# Patient Record
Sex: Female | Born: 1935 | Race: White | Hispanic: No | Smoking: Never smoker
Health system: Southern US, Community
[De-identification: ages and names within clinical notes are randomized; demographics above are authoritative.]

## PROBLEM LIST (undated history)

## (undated) DIAGNOSIS — I10 Essential (primary) hypertension: Secondary | ICD-10-CM

## (undated) DIAGNOSIS — I219 Acute myocardial infarction, unspecified: Secondary | ICD-10-CM

## (undated) DIAGNOSIS — E785 Hyperlipidemia, unspecified: Secondary | ICD-10-CM

## (undated) DIAGNOSIS — I2510 Atherosclerotic heart disease of native coronary artery without angina pectoris: Secondary | ICD-10-CM

## (undated) DIAGNOSIS — R55 Syncope and collapse: Secondary | ICD-10-CM

## (undated) HISTORY — PX: CATARACT EXTRACTION, BILATERAL: SHX1313

## (undated) HISTORY — PX: CORONARY ANGIOPLASTY: SHX604

## (undated) HISTORY — DX: Atherosclerotic heart disease of native coronary artery without angina pectoris: I25.10

## (undated) HISTORY — PX: RENAL ARTERY STENT: SHX2321

## (undated) HISTORY — DX: Essential (primary) hypertension: I10

## (undated) HISTORY — DX: Hyperlipidemia, unspecified: E78.5

---

## 1994-03-16 HISTORY — PX: CORONARY ARTERY BYPASS GRAFT: SHX141

## 2010-06-19 ENCOUNTER — Other Ambulatory Visit: Payer: Self-pay | Admitting: Cardiovascular Disease

## 2010-06-19 ENCOUNTER — Encounter: Payer: Self-pay | Admitting: Cardiovascular Disease

## 2010-06-19 DIAGNOSIS — I10 Essential (primary) hypertension: Secondary | ICD-10-CM

## 2010-06-19 DIAGNOSIS — Z79899 Other long term (current) drug therapy: Secondary | ICD-10-CM

## 2010-06-19 DIAGNOSIS — E785 Hyperlipidemia, unspecified: Secondary | ICD-10-CM

## 2010-06-23 ENCOUNTER — Encounter: Payer: Self-pay | Admitting: Cardiovascular Disease

## 2010-06-23 ENCOUNTER — Other Ambulatory Visit: Payer: Self-pay | Admitting: Cardiovascular Disease

## 2010-06-23 ENCOUNTER — Ambulatory Visit: Payer: Self-pay | Admitting: Cardiovascular Disease

## 2010-06-23 ENCOUNTER — Other Ambulatory Visit: Payer: Medicare Other | Admitting: *Deleted

## 2010-06-23 VITALS — BP 120/82 | HR 70 | Wt 101.0 lb

## 2010-06-23 DIAGNOSIS — Z79899 Other long term (current) drug therapy: Secondary | ICD-10-CM

## 2010-06-23 DIAGNOSIS — E782 Mixed hyperlipidemia: Secondary | ICD-10-CM | POA: Insufficient documentation

## 2010-06-23 DIAGNOSIS — E785 Hyperlipidemia, unspecified: Secondary | ICD-10-CM

## 2010-06-23 DIAGNOSIS — I10 Essential (primary) hypertension: Secondary | ICD-10-CM

## 2010-06-23 DIAGNOSIS — I25118 Atherosclerotic heart disease of native coronary artery with other forms of angina pectoris: Secondary | ICD-10-CM | POA: Insufficient documentation

## 2010-06-23 DIAGNOSIS — R5383 Other fatigue: Secondary | ICD-10-CM | POA: Insufficient documentation

## 2010-06-23 LAB — HEPATIC FUNCTION PANEL
Albumin: 4.4 g/dL (ref 3.5–5.2)
Total Bilirubin: 0.8 mg/dL (ref 0.3–1.2)

## 2010-06-23 LAB — LIPID PANEL
Cholesterol: 219 mg/dL — ABNORMAL HIGH (ref 0–200)
HDL: 79.9 mg/dL (ref >39.00)
Triglycerides: 65 mg/dL (ref 0.0–149.0)
VLDL: 13 mg/dL (ref 0.0–40.0)

## 2010-06-23 LAB — BASIC METABOLIC PANEL
BUN: 15 mg/dL (ref 6–23)
Creatinine, Ser: 0.9 mg/dL (ref 0.4–1.2)
GFR: 68.84 mL/min (ref >60.00)
Glucose, Bld: 78 mg/dL (ref 70–99)

## 2010-06-23 LAB — LDL CHOLESTEROL, DIRECT: Direct LDL: 120.5 mg/dL

## 2010-06-23 LAB — TSH: TSH: 2.67 u[IU]/mL (ref 0.35–5.50)

## 2010-06-23 MED ORDER — EZETIMIBE 10 MG PO TABS: 10.0000 mg | ORAL_TABLET | Freq: Every day | ORAL | Status: DC

## 2010-06-23 MED ORDER — METOPROLOL TARTRATE 25 MG PO TABS: 25.0000 mg | ORAL_TABLET | Freq: Two times a day (BID) | ORAL | Status: DC

## 2010-06-23 MED ORDER — LISINOPRIL 10 MG PO TABS: 10.0000 mg | ORAL_TABLET | Freq: Every day | ORAL | Status: DC

## 2010-06-23 NOTE — Progress Notes (Signed)
History of Present Illness:  Sally Miller is an elderly female with a history of coronary artery disease. She status post CABG in 1996. She also status post PTCA and stenting of her left anterior descending artery via her IMA artery. She has a history of hyperlipidemia and hypertension.  She has not had any episodes of chest pain or shortness breath. She complains of being very fatigued. She has no energy. She also notes some hair loss. Current Outpatient Prescriptions on File Prior to Visit  Medication Sig Dispense Refill  . aspirin 81 MG tablet Take 81 mg by mouth daily.       . rosuvastatin (CRESTOR) 10 MG tablet Take 10 mg by mouth daily.       Marland Kitchen DISCONTD: ezetimibe (ZETIA) 10 MG tablet Take 10 mg by mouth daily.       Marland Kitchen DISCONTD: lisinopril (PRINIVIL,ZESTRIL) 10 MG tablet Take 10 mg by mouth daily.       Marland Kitchen DISCONTD: metoprolol tartrate (LOPRESSOR) 25 MG tablet Take 25 mg by mouth 2 (two) times daily.         No Known Allergies  Past Medical History  Diagnosis Date  . CAD (coronary artery disease)   . Hyperlipemia   . HTN (hypertension)     Past Surgical History  Procedure Date  . Coronary artery bypass graft 1996  . Coronary angioplasty   . Renal artery stent     History  Smoking status  . Never Smoker   Smokeless tobacco  . Not on file    History  Alcohol Use No    Family History  Problem Relation Age of Onset  . Heart attack Brother   . Heart attack Brother   . Heart failure Sister   . Coronary artery disease Sister   . Coronary artery disease Brother     had cabg    Reviw of Systems:  The patient denies any heat or cold intolerance.  No weight gain or weight loss.  The patient denies headaches or blurry vision.  There is no cough or sputum production.  The patient denies dizziness.  There is no hematuria or hematochezia.  The patient denies any muscle aches or arthritis.  The patient denies any rash.  The patient denies frequent falling or instability.  There is  no history of depression or anxiety.  All other systems were reviewed and are negative.  Physical Exam: BP 120/82  Pulse 70  Wt 101 lb (45.813 kg) The patient is alert and oriented x 3.  The mood and affect are normal.  The skin is warm and dry.  Color is normal.  The HEENT exam reveals that the sclera are nonicteric.  The mucous membranes are moist.  The carotids are 2+ without bruits.  There is no thyromegaly.  There is no JVD.  The lungs are clear.  The chest wall is non tender.  The heart exam reveals a regular rate with a normal S1 and S2.  There are no murmurs, gallops, or rubs.  The PMI is not displaced.   Abdominal exam reveals good bowel sounds.  There is no guarding or rebound.  There is no hepatosplenomegaly or tenderness.  There are no masses.  Exam of the legs reveal no clubbing, cyanosis, or edema.  The legs are without rashes.  The distal pulses are intact.  Cranial nerves II - XII are intact.  Motor and sensory functions are intact.  The gait is normal.  ECG: Normal sinus rhythm. She has  no ST or T wave changes. Assessment / Plan:   This encounter was created in error - please disregard.

## 2010-06-23 NOTE — Assessment & Plan Note (Signed)
Fatigue seems to be her biggest complaint today. We'll check a TSH and a vitamin D level. She has had some other lab work done at her medical doctor's office. We may need to check a CBC and complete metabolic profile at her next visit

## 2010-06-23 NOTE — Assessment & Plan Note (Signed)
With the is stable from a cardiac standpoint. I'm pleased that she's not having any episodes of chest pain or shortness breath. We will continue with her same medications.

## 2010-06-25 LAB — VITAMIN D 25 HYDROXY (VIT D DEFICIENCY, FRACTURES): Vit D, 25-Hydroxy: 37 ng/mL (ref 30–89)

## 2010-06-26 ENCOUNTER — Other Ambulatory Visit: Payer: Self-pay | Admitting: Cardiovascular Disease

## 2010-06-26 ENCOUNTER — Telehealth: Payer: Self-pay | Admitting: Cardiovascular Disease

## 2010-06-26 DIAGNOSIS — E785 Hyperlipidemia, unspecified: Secondary | ICD-10-CM

## 2010-06-26 NOTE — Telephone Encounter (Signed)
Patient called with lab results. Pt verbalized understanding. Pt will increase her crestor to 10mg  po daily, pt was on 5mg  daily for one month..will re check labs in October. Is this ok with you. Alfonso Ramus RN

## 2010-06-27 ENCOUNTER — Telehealth: Payer: Self-pay | Admitting: *Deleted

## 2010-06-27 NOTE — Telephone Encounter (Signed)
Patient called with lab results. Vit D 1000IU daily . Pt verbalized understanding. Alfonso Ramus RN

## 2010-10-18 ENCOUNTER — Other Ambulatory Visit: Payer: Self-pay | Admitting: Cardiovascular Disease

## 2010-10-20 ENCOUNTER — Other Ambulatory Visit: Payer: Self-pay | Admitting: *Deleted

## 2010-10-20 MED ORDER — ROSUVASTATIN CALCIUM 10 MG PO TABS: 10.0000 mg | ORAL_TABLET | Freq: Every day | ORAL | Status: DC

## 2010-10-20 NOTE — Telephone Encounter (Signed)
Fax received from pharmacy. Refill completed. Jodette Laylia Mui RN  

## 2011-03-03 ENCOUNTER — Encounter: Payer: Self-pay | Admitting: Cardiovascular Disease

## 2011-03-03 ENCOUNTER — Ambulatory Visit (INDEPENDENT_AMBULATORY_CARE_PROVIDER_SITE_OTHER): Payer: Medicare Other | Admitting: Cardiovascular Disease

## 2011-03-03 VITALS — BP 110/70 | HR 70 | Ht 62.0 in | Wt 96.8 lb

## 2011-03-03 DIAGNOSIS — I2510 Atherosclerotic heart disease of native coronary artery without angina pectoris: Secondary | ICD-10-CM

## 2011-03-03 DIAGNOSIS — E785 Hyperlipidemia, unspecified: Secondary | ICD-10-CM

## 2011-03-03 NOTE — Assessment & Plan Note (Signed)
Sally Miller is doing fairly well. She's not having any complaints. We'll continue with her same medications. I'll see her again in 6 months for an office visit and a fasting labs.

## 2011-03-03 NOTE — Patient Instructions (Signed)
Your physician wants you to follow-up in: 6 months,You will receive a reminder letter in the mail two months in advance. If you don't receive a letter, please call our office to schedule the follow-up appointment.  Your physician recommends that you return for a FASTING lipid profile: 6 months 

## 2011-03-03 NOTE — Progress Notes (Signed)
    Sally Miller Date of Birth  1930-11-23  [facility] [address]    [address] [place], [place]  [address] [phone]  Fax  [phone]  History of Present Illness:  Sally Miller is a 77 year old female with a history of coronary artery disease and coronary artery bypass grafting. She's had stenting of her left anterior descending artery via her IMA. She's done well since I last saw her. She's not had any episodes of chest pain or shortness of breath..    Current Outpatient Prescriptions on File Prior to Visit  Medication Sig Dispense Refill  . aspirin 81 MG tablet Take 81 mg by mouth daily.       Marland Kitchen ezetimibe (ZETIA) 10 MG tablet Take 1 tablet (10 mg total) by mouth daily.  90 tablet  3  . lisinopril (PRINIVIL,ZESTRIL) 10 MG tablet Take 1 tablet (10 mg total) by mouth daily.  90 tablet  3  . metoprolol tartrate (LOPRESSOR) 25 MG tablet Take 1 tablet (25 mg total) by mouth 2 (two) times daily.  180 tablet  3  . rosuvastatin (CRESTOR) 10 MG tablet Take 1 tablet (10 mg total) by mouth daily.  90 tablet  2    No Known Allergies  Past Medical History  Diagnosis Date  . CAD (coronary artery disease)   . Hyperlipemia   . HTN (hypertension)     Past Surgical History  Procedure Date  . Coronary artery bypass graft 1996  . Coronary angioplasty   . Renal artery stent     History  Smoking status  . Never Smoker   Smokeless tobacco  . Not on file    History  Alcohol Use No    Family History  Problem Relation Age of Onset  . Heart attack Brother   . Heart attack Brother   . Heart failure Sister   . Coronary artery disease Sister   . Coronary artery disease Brother     had cabg    Reviw of Systems:  Reviewed in the HPI.  All other systems are negative.  Physical Exam: BP 166/79  Pulse 70  Ht 5\' 2"  (1.575 m)  Wt 96 lb 12.8 oz (43.908 kg)  BMI 17.70 kg/m2 The patient is alert and oriented x 3.  The mood and affect are normal.   Skin: warm and dry.   Color is normal.    HEENT:   Normocephalic/atraumatic. There is no JVD. Carotids normal  Lungs: Lungs are clear.   Heart: Regular rate S1-S2. There are no murmurs.    Abdomen: Her abdomen is thin. She has good bowel sounds. There is no hepatosplenomegaly.  Extremities:  No clubbing cyanosis or edema.  Neuro:  Exam is nonfocal. Her gait is normal.    ECG:   Assessment / Plan:

## 2011-08-17 ENCOUNTER — Other Ambulatory Visit: Payer: Self-pay | Admitting: Cardiovascular Disease

## 2011-08-17 NOTE — Telephone Encounter (Signed)
Fax Received. Refill Completed. Reta Norgren Chowoe (R.M.A)   

## 2011-09-25 ENCOUNTER — Other Ambulatory Visit: Payer: Self-pay | Admitting: Cardiovascular Disease

## 2011-10-01 ENCOUNTER — Other Ambulatory Visit: Payer: Self-pay | Admitting: Cardiovascular Disease

## 2011-10-01 MED ORDER — EZETIMIBE 10 MG PO TABS: 10.0000 mg | ORAL_TABLET | Freq: Every day | ORAL | Status: DC

## 2011-10-01 NOTE — Telephone Encounter (Signed)
Pt called and [facility] told her they didn't have rx for zetia please resend

## 2011-11-09 ENCOUNTER — Ambulatory Visit (INDEPENDENT_AMBULATORY_CARE_PROVIDER_SITE_OTHER): Payer: Medicare Other

## 2011-11-09 DIAGNOSIS — Z79899 Other long term (current) drug therapy: Secondary | ICD-10-CM

## 2011-11-09 DIAGNOSIS — E785 Hyperlipidemia, unspecified: Secondary | ICD-10-CM

## 2011-11-09 DIAGNOSIS — R0989 Other specified symptoms and signs involving the circulatory and respiratory systems: Secondary | ICD-10-CM

## 2011-11-12 ENCOUNTER — Ambulatory Visit (INDEPENDENT_AMBULATORY_CARE_PROVIDER_SITE_OTHER): Payer: Medicare Other | Admitting: Cardiovascular Disease

## 2011-11-12 ENCOUNTER — Encounter: Payer: Self-pay | Admitting: Cardiovascular Disease

## 2011-11-12 VITALS — BP 124/67 | HR 79 | Ht 61.0 in | Wt 95.5 lb

## 2011-11-12 DIAGNOSIS — I10 Essential (primary) hypertension: Secondary | ICD-10-CM

## 2011-11-12 DIAGNOSIS — E785 Hyperlipidemia, unspecified: Secondary | ICD-10-CM

## 2011-11-12 DIAGNOSIS — I2510 Atherosclerotic heart disease of native coronary artery without angina pectoris: Secondary | ICD-10-CM

## 2011-11-12 MED ORDER — ROSUVASTATIN CALCIUM 20 MG PO TABS: 20.0000 mg | ORAL_TABLET | Freq: Every day | ORAL | Status: DC

## 2011-11-12 MED ORDER — NITROGLYCERIN 0.4 MG SL SUBL: 0.4000 mg | SUBLINGUAL_TABLET | SUBLINGUAL | Status: DC | PRN

## 2011-11-12 NOTE — Progress Notes (Signed)
    Sally Miller Date of Birth  11-May-1930       [facility]    [place] [address]  [address] [place], [place]  [address]   [place], [place]  [address] [phone]     [phone]   Fax  [phone]    Fax [phone]  Problem List: Coronary artery disease 2. Hypertension 3. Hyperlipidemia  History of Present Illness:  Sally Miller is a 78 yo with the above medical history.  She has been busy doing her yard work - no CP  Current Outpatient Prescriptions on File Prior to Visit  Medication Sig Dispense Refill  . nitroGLYCERIN (NITROSTAT) 0.4 MG SL tablet Place 0.4 mg under the tongue every 5 (five) minutes as needed.      Marland Kitchen aspirin 81 MG tablet Take 81 mg by mouth daily.       . CRESTOR 10 MG tablet TAKE ONE TABLET BY MOUTH EVERY DAY  90 each  3  . ezetimibe (ZETIA) 10 MG tablet Take 1 tablet (10 mg total) by mouth daily.  30 tablet  0  . lisinopril (PRINIVIL,ZESTRIL) 10 MG tablet TAKE ONE TABLET BY MOUTH EVERY DAY  90 tablet  3  . metoprolol tartrate (LOPRESSOR) 25 MG tablet Take 1 tablet (25 mg total) by mouth 2 (two) times daily.  180 tablet  3    No Known Allergies  Past Medical History  Diagnosis Date  . CAD (coronary artery disease)   . Hyperlipemia   . HTN (hypertension)     Past Surgical History  Procedure Date  . Coronary artery bypass graft 1996  . Coronary angioplasty   . Renal artery stent     History  Smoking status  . Never Smoker   Smokeless tobacco  . Not on file    History  Alcohol Use No    Family History  Problem Relation Age of Onset  . Heart attack Brother   . Heart attack Brother   . Heart failure Sister   . Coronary artery disease Sister   . Coronary artery disease Brother     had cabg    Reviw of Systems:  Reviewed in the HPI.  All other systems are negative.  Physical Exam: Blood pressure 124/67, pulse 79, height 5\' 1"  (1.549 m), weight 95 lb 8 oz (43.319 kg). General:  Well developed, well nourished, in no acute distress.  Head: Normocephalic, atraumatic, sclera non-icteric, mucus membranes are moist,   Neck: Supple. Carotids are 2 + without bruits. No JVD  Lungs: Clear bilaterally to auscultation.  Heart: regular rate.  normal  S1 S2. No murmurs, gallops or rubs.  Abdomen: Soft, non-tender, non-distended with normal bowel sounds. No hepatomegaly. No rebound/guarding. No masses.  Msk:  Strength and tone are normal  Extremities: No clubbing or cyanosis. No edema.  Distal pedal pulses are 2+ and equal bilaterally.  Neuro: Alert and oriented X 3. Moves all extremities spontaneously.  Psych:  Responds to questions appropriately with a normal affect.  ECG:  Assessment / Plan:

## 2011-11-12 NOTE — Addendum Note (Signed)
Addended by: Sabino Snipes E on: 11/12/2011 02:34 PM   Modules accepted: Orders

## 2011-11-12 NOTE — Assessment & Plan Note (Signed)
Mrs. Schendel seems to be doing well. She's not had any episodes of chest pain or shortness of breath. She continues to work out in her yard and garden without difficulties. I will see her again in 6 months.

## 2011-11-12 NOTE — Assessment & Plan Note (Signed)
She had labs drawn at the hospital. We were not able to get her blood here. Her total cholesterol is 202. Her LDL is 118. We will increase her Crestor to 20 mg a day. We'll check a liver profile, lipid profile, and basic metabolic profile in 3 months. I'll see her again in 6 months for office visit and fasting labs.

## 2011-11-12 NOTE — Patient Instructions (Signed)
Your physician wants you to follow-up in: 6 months with Dr. Elease Hashimoto. You will receive a reminder letter in the mail two months in advance. If you don't receive a letter, please call our office to schedule the follow-up appointment.  Your physician recommends that you return for lab work in: 3 months and again in 6 months prior to appointment with Dr. Elease Hashimoto.  Your physician has recommended you make the following change in your medication:  -Increase Crestor to 20 mg daily

## 2012-01-06 ENCOUNTER — Other Ambulatory Visit: Payer: Self-pay

## 2012-01-06 DIAGNOSIS — I2510 Atherosclerotic heart disease of native coronary artery without angina pectoris: Secondary | ICD-10-CM

## 2012-01-06 DIAGNOSIS — E785 Hyperlipidemia, unspecified: Secondary | ICD-10-CM

## 2012-02-01 ENCOUNTER — Other Ambulatory Visit: Payer: Self-pay | Admitting: *Deleted

## 2012-02-01 MED ORDER — METOPROLOL TARTRATE 25 MG PO TABS: 25.0000 mg | ORAL_TABLET | Freq: Two times a day (BID) | ORAL | Status: DC

## 2012-02-01 NOTE — Telephone Encounter (Signed)
Fax Received. Refill Completed. Sally Miller (R.M.A)   

## 2012-02-19 ENCOUNTER — Other Ambulatory Visit: Payer: Medicare Other

## 2012-02-19 ENCOUNTER — Ambulatory Visit (INDEPENDENT_AMBULATORY_CARE_PROVIDER_SITE_OTHER): Payer: Medicare Other

## 2012-02-19 DIAGNOSIS — I2510 Atherosclerotic heart disease of native coronary artery without angina pectoris: Secondary | ICD-10-CM

## 2012-02-19 DIAGNOSIS — E785 Hyperlipidemia, unspecified: Secondary | ICD-10-CM

## 2012-02-25 ENCOUNTER — Telehealth: Payer: Self-pay | Admitting: Cardiovascular Disease

## 2012-02-25 NOTE — Telephone Encounter (Signed)
Do you mind reviewing most recent lab results for pt? Total chol better (from 202 to 193), LDL better (from 118 to 98) Thanks!

## 2012-02-25 NOTE — Telephone Encounter (Signed)
Pt calling for labs results.

## 2012-02-26 NOTE — Telephone Encounter (Signed)
Patient notified of lab work per Dr. Elease Hashimoto lab work looks much better along with all other labs are okay.

## 2012-02-26 NOTE — Telephone Encounter (Signed)
Cholesterol is better.  Other labs look fine

## 2012-02-26 NOTE — Telephone Encounter (Signed)
See below

## 2012-06-23 ENCOUNTER — Telehealth: Payer: Self-pay | Admitting: *Deleted

## 2012-06-23 NOTE — Telephone Encounter (Signed)
Received office note from Dr Randa Lynn, per note pt had syncopal episode, he wrote an order for stress echo, per Dr Elease Hashimoto he would like that changed to a [employer]. Pt is seen in [place], Wess Botts RN was contacted/ she will order and contact pt.

## 2012-06-24 ENCOUNTER — Other Ambulatory Visit: Payer: Self-pay

## 2012-06-24 DIAGNOSIS — I2510 Atherosclerotic heart disease of native coronary artery without angina pectoris: Secondary | ICD-10-CM

## 2012-06-24 NOTE — Telephone Encounter (Signed)
Pt is going to check with dtr re: dates for lexi and call me back

## 2012-06-24 NOTE — Telephone Encounter (Signed)
Pt informed of instructions JX:BJYN 4/25 Understanding verb

## 2012-06-29 ENCOUNTER — Other Ambulatory Visit (HOSPITAL_COMMUNITY): Payer: Medicare Other

## 2012-07-08 ENCOUNTER — Ambulatory Visit: Payer: Self-pay

## 2012-07-08 DIAGNOSIS — R079 Chest pain, unspecified: Secondary | ICD-10-CM

## 2012-07-08 LAB — BASIC METABOLIC PANEL
Anion Gap: 6 — ABNORMAL LOW (ref 7–16)
BUN: 16 mg/dL (ref 7–18)
Calcium, Total: 9.1 mg/dL (ref 8.5–10.1)
Co2: 28 mmol/L (ref 21–32)
EGFR (African American): >60
EGFR (Non-African Amer.): >60
Glucose: 132 mg/dL — ABNORMAL HIGH (ref 65–99)
Osmolality: 277 (ref 275–301)
Potassium: 4 mmol/L (ref 3.5–5.1)

## 2012-07-08 LAB — CBC
HGB: 12.9 g/dL (ref 12.0–16.0)
MCHC: 33 g/dL (ref 32.0–36.0)
MCV: 93 fL (ref 80–100)
RBC: 4.2 10*6/uL (ref 3.80–5.20)
RDW: 13 % (ref 11.5–14.5)

## 2012-07-08 LAB — TROPONIN I
Troponin-I: 0.02 ng/mL
Troponin-I: 0.53 ng/mL — ABNORMAL HIGH

## 2012-07-09 ENCOUNTER — Inpatient Hospital Stay: Payer: Self-pay | Admitting: Internal Medicine

## 2012-07-09 DIAGNOSIS — I214 Non-ST elevation (NSTEMI) myocardial infarction: Secondary | ICD-10-CM

## 2012-07-09 LAB — CK TOTAL AND CKMB (NOT AT ARMC)
CK, Total: 142 U/L (ref 21–215)
CK-MB: 3.2 ng/mL (ref 0.5–3.6)

## 2012-07-09 LAB — APTT
Activated PTT: >160 secs (ref 23.6–35.9)
Activated PTT: 79.7 secs — ABNORMAL HIGH (ref 23.6–35.9)

## 2012-07-10 LAB — CBC WITH DIFFERENTIAL/PLATELET
Basophil %: 0.4 %
Eosinophil #: 0.1 10*3/uL (ref 0.0–0.7)
HCT: 39 % (ref 35.0–47.0)
MCV: 93 fL (ref 80–100)
Monocyte #: 0.6 x10 3/mm (ref 0.2–0.9)
Monocyte %: 9.7 %
Neutrophil #: 3.5 10*3/uL (ref 1.4–6.5)
Platelet: 163 10*3/uL (ref 150–440)
RBC: 4.2 10*6/uL (ref 3.80–5.20)
RDW: 13.1 % (ref 11.5–14.5)

## 2012-07-10 LAB — BASIC METABOLIC PANEL
BUN: 13 mg/dL (ref 7–18)
Calcium, Total: 9.2 mg/dL (ref 8.5–10.1)
Co2: 27 mmol/L (ref 21–32)
Creatinine: 0.63 mg/dL (ref 0.60–1.30)
EGFR (African American): >60
EGFR (Non-African Amer.): >60
Osmolality: 279 (ref 275–301)
Potassium: 3.8 mmol/L (ref 3.5–5.1)

## 2012-07-10 LAB — APTT: Activated PTT: 94.8 secs — ABNORMAL HIGH (ref 23.6–35.9)

## 2012-07-10 LAB — LIPID PANEL
Ldl Cholesterol, Calc: 94 mg/dL (ref 0–100)
Triglycerides: 61 mg/dL (ref 0–200)
VLDL Cholesterol, Calc: 12 mg/dL (ref 5–40)

## 2012-07-10 LAB — CK TOTAL AND CKMB (NOT AT ARMC)
CK, Total: 138 U/L (ref 21–215)
CK-MB: 2.4 ng/mL (ref 0.5–3.6)

## 2012-07-11 ENCOUNTER — Other Ambulatory Visit: Payer: Self-pay | Admitting: *Deleted

## 2012-07-11 DIAGNOSIS — I701 Atherosclerosis of renal artery: Secondary | ICD-10-CM

## 2012-07-11 DIAGNOSIS — I2510 Atherosclerotic heart disease of native coronary artery without angina pectoris: Secondary | ICD-10-CM

## 2012-07-11 LAB — CBC WITH DIFFERENTIAL/PLATELET
Eosinophil #: 0.1 10*3/uL (ref 0.0–0.7)
Eosinophil %: 2.3 %
HCT: 38.5 % (ref 35.0–47.0)
Lymphocyte #: 2 10*3/uL (ref 1.0–3.6)
MCH: 31.1 pg (ref 26.0–34.0)
MCHC: 33.7 g/dL (ref 32.0–36.0)
MCV: 92 fL (ref 80–100)
Monocyte #: 0.6 x10 3/mm (ref 0.2–0.9)
Monocyte %: 9.8 %
Neutrophil #: 3.3 10*3/uL (ref 1.4–6.5)
RBC: 4.17 10*6/uL (ref 3.80–5.20)
RDW: 13.1 % (ref 11.5–14.5)

## 2012-07-11 LAB — BASIC METABOLIC PANEL
Anion Gap: 9 (ref 7–16)
Co2: 26 mmol/L (ref 21–32)
Creatinine: 0.58 mg/dL — ABNORMAL LOW (ref 0.60–1.30)
EGFR (Non-African Amer.): >60
Glucose: 89 mg/dL (ref 65–99)
Osmolality: 280 (ref 275–301)

## 2012-07-11 LAB — APTT: Activated PTT: 60.1 secs — ABNORMAL HIGH (ref 23.6–35.9)

## 2012-07-19 ENCOUNTER — Encounter: Payer: Medicare Other | Admitting: Cardiovascular Disease

## 2012-09-21 ENCOUNTER — Other Ambulatory Visit: Payer: Self-pay | Admitting: Cardiovascular Disease

## 2012-10-25 ENCOUNTER — Other Ambulatory Visit: Payer: Self-pay | Admitting: *Deleted

## 2012-10-25 MED ORDER — EZETIMIBE 10 MG PO TABS: 10.0000 mg | ORAL_TABLET | Freq: Every day | ORAL | Status: DC

## 2012-10-25 NOTE — Telephone Encounter (Signed)
Refilled zetia #30 refill#0 sent to [facility] pharmacy.

## 2012-12-20 ENCOUNTER — Encounter: Payer: Self-pay | Admitting: Cardiovascular Disease

## 2012-12-20 ENCOUNTER — Ambulatory Visit (INDEPENDENT_AMBULATORY_CARE_PROVIDER_SITE_OTHER): Payer: Medicare Other | Admitting: Cardiovascular Disease

## 2012-12-20 VITALS — BP 140/76 | HR 81 | Ht 61.0 in | Wt 92.5 lb

## 2012-12-20 DIAGNOSIS — R0602 Shortness of breath: Secondary | ICD-10-CM

## 2012-12-20 DIAGNOSIS — I2510 Atherosclerotic heart disease of native coronary artery without angina pectoris: Secondary | ICD-10-CM

## 2012-12-20 NOTE — Progress Notes (Signed)
Sally Miller Date of Birth  [birth year]       [facility]    [place] [address]  [address] [place], [place]  [address]   [place], [place]  [address] [phone]     [phone]   Fax  [phone]    Fax [phone]  Problem List: Coronary artery disease- s/p CABG 2. Hypertension 3. Hyperlipidemia  History of Present Illness:  Sally Miller is a 79 yo with the above medical history.  She has been busy doing her yard work - no CP  Oct. 7, 2014:  Sally Miller had several episodes of syncope earlier this spring. She was referred for a stress Myoview study which was abnormal. She was admitted to the hospital. She had a cardiac catheterization which revealed patent grafts. The native right coronary artery was chronically occluded and filled via collaterals.   She has not had any further episodes of syncope.  She thinks she may have been dehydrated.    She has been able to do all of her normal activities without severe complication.  She does have chronic dyspnea .   Current Outpatient Prescriptions on File Prior to Visit  Medication Sig Dispense Refill  . aspirin 81 MG tablet Take 81 mg by mouth daily.       Marland Kitchen ezetimibe (ZETIA) 10 MG tablet Take 1 tablet (10 mg total) by mouth daily.  30 tablet  0  . lisinopril (PRINIVIL,ZESTRIL) 10 MG tablet TAKE ONE TABLET BY MOUTH EVERY DAY  90 tablet  0  . metoprolol tartrate (LOPRESSOR) 25 MG tablet Take 1 tablet (25 mg total) by mouth 2 (two) times daily.  180 tablet  3  . nitroGLYCERIN (NITROSTAT) 0.4 MG SL tablet Place 1 tablet (0.4 mg total) under the tongue every 5 (five) minutes as needed.  25 tablet  3  . rosuvastatin (CRESTOR) 20 MG tablet Take 1 tablet (20 mg total) by mouth daily.  90 tablet  3   No current facility-administered medications on file prior to visit.    No Known Allergies  Past Medical History  Diagnosis Date  . CAD (coronary artery disease)   . Hyperlipemia     . HTN (hypertension)     Past Surgical History  Procedure Laterality Date  . Coronary artery bypass graft  1996  . Coronary angioplasty    . Renal artery stent      History  Smoking status  . Never Smoker   Smokeless tobacco  . Not on file    History  Alcohol Use No    Family History  Problem Relation Age of Onset  . Heart attack Brother   . Heart attack Brother   . Heart failure Sister   . Coronary artery disease Sister   . Coronary artery disease Brother     had cabg    Reviw of Systems:  Reviewed in the HPI.  All other systems are negative.  Physical Exam: Blood pressure 140/76, pulse 81, height 5\' 1"  (1.549 m), weight 92 lb 8 oz (41.958 kg). General: Well developed, well nourished, in no acute distress.  Head: Normocephalic, atraumatic, sclera non-icteric, mucus membranes are moist,   Neck: Supple. Carotids are 2 + without bruits. No JVD  Lungs: Clear bilaterally to auscultation.  Heart: regular rate.  normal  S1 S2. No murmurs, gallops or rubs.  Abdomen: Soft, non-tender, non-distended with normal bowel sounds. No hepatomegaly. No rebound/guarding. No masses.  Msk:  Strength and tone are normal  Extremities: No clubbing or cyanosis. No edema.  Distal pedal pulses are 2+ and equal bilaterally.  Neuro: Alert and oriented X 3. Moves all extremities spontaneously.  Psych:  Responds to questions appropriately with a normal affect.  ECG: Oct. 7, 2014:  NSR at 81 , NS ST /T abn.   Assessment / Plan:

## 2012-12-20 NOTE — Assessment & Plan Note (Signed)
Mrs. Paull presents for followup of her recent hospitalization. She had an episode of syncope and subsequent had a stress Myoview study. She had chest pain during the Myoview study and was admitted to the hospital. She had cardiac catheterization which revealed patent LIMA to the LAD, SVG to the diagonal vessel and SVG to the OM. The right coronary artery was chronically occluded and was not bypassed. It was supplied by left to right collaterals.  There is a stent in the left complex artery that had moderate in-stent restenosis.  She ruled out for myocardial infarction. She did fairly well and was discharged in satisfactory condition several days later.  She's not had any further episodes of chest discomfort. She was found to have mild right renal artery stenosis but at this time does not have uncontrolled hypertension and does not have significant renal impairment.

## 2012-12-20 NOTE — Patient Instructions (Addendum)
Your physician wants you to follow-up in: 6 months.  You will receive a reminder letter in the mail two months in advance. If you don't receive a letter, please call our office to schedule the follow-up appointment.  Your physician recommends that you return for lab work at that time.

## 2012-12-23 ENCOUNTER — Encounter: Payer: Self-pay | Admitting: Cardiovascular Disease

## 2012-12-23 ENCOUNTER — Other Ambulatory Visit: Payer: Self-pay | Admitting: Cardiovascular Disease

## 2012-12-24 ENCOUNTER — Encounter: Payer: Self-pay | Admitting: Cardiovascular Disease

## 2012-12-26 ENCOUNTER — Other Ambulatory Visit: Payer: Self-pay

## 2012-12-26 DIAGNOSIS — I25810 Atherosclerosis of coronary artery bypass graft(s) without angina pectoris: Secondary | ICD-10-CM

## 2012-12-26 DIAGNOSIS — I10 Essential (primary) hypertension: Secondary | ICD-10-CM

## 2012-12-26 DIAGNOSIS — E785 Hyperlipidemia, unspecified: Secondary | ICD-10-CM

## 2012-12-26 MED ORDER — METOPROLOL TARTRATE 25 MG PO TABS: 25.0000 mg | ORAL_TABLET | Freq: Two times a day (BID) | ORAL | Status: DC

## 2012-12-26 MED ORDER — ASPIRIN 81 MG PO TABS: 81.0000 mg | ORAL_TABLET | Freq: Every day | ORAL | Status: AC

## 2012-12-26 MED ORDER — LISINOPRIL 10 MG PO TABS: 10.0000 mg | ORAL_TABLET | Freq: Every day | ORAL | Status: DC

## 2012-12-26 MED ORDER — NITROGLYCERIN 0.4 MG SL SUBL: 0.4000 mg | SUBLINGUAL_TABLET | SUBLINGUAL | Status: DC | PRN

## 2012-12-26 MED ORDER — EZETIMIBE 10 MG PO TABS: 10.0000 mg | ORAL_TABLET | Freq: Every day | ORAL | Status: DC

## 2012-12-27 ENCOUNTER — Encounter: Payer: Self-pay | Admitting: Cardiovascular Disease

## 2012-12-28 ENCOUNTER — Other Ambulatory Visit: Payer: Self-pay | Admitting: *Deleted

## 2012-12-28 MED ORDER — ROSUVASTATIN CALCIUM 20 MG PO TABS: 20.0000 mg | ORAL_TABLET | Freq: Every day | ORAL | Status: DC

## 2012-12-28 NOTE — Telephone Encounter (Signed)
Wants a refill on crestor 10mg . Patient's daughter having a difficult time refilling meds please call her at her work number. Thanks

## 2012-12-28 NOTE — Telephone Encounter (Signed)
Refilled Crestor sent to [facility] pharmacy.

## 2013-01-02 ENCOUNTER — Other Ambulatory Visit: Payer: Self-pay

## 2013-01-02 DIAGNOSIS — E785 Hyperlipidemia, unspecified: Secondary | ICD-10-CM

## 2013-01-02 MED ORDER — ROSUVASTATIN CALCIUM 20 MG PO TABS: 20.0000 mg | ORAL_TABLET | Freq: Every day | ORAL | Status: DC

## 2013-01-19 ENCOUNTER — Other Ambulatory Visit: Payer: Self-pay

## 2013-11-10 ENCOUNTER — Ambulatory Visit (INDEPENDENT_AMBULATORY_CARE_PROVIDER_SITE_OTHER): Payer: Medicare Other | Admitting: Cardiovascular Disease

## 2013-11-10 ENCOUNTER — Encounter: Payer: Self-pay | Admitting: Cardiovascular Disease

## 2013-11-10 ENCOUNTER — Other Ambulatory Visit: Payer: Self-pay | Admitting: Nurse Practitioner

## 2013-11-10 VITALS — BP 140/80 | HR 76 | Ht 61.0 in | Wt 87.4 lb

## 2013-11-10 DIAGNOSIS — I25810 Atherosclerosis of coronary artery bypass graft(s) without angina pectoris: Secondary | ICD-10-CM

## 2013-11-10 DIAGNOSIS — E785 Hyperlipidemia, unspecified: Secondary | ICD-10-CM

## 2013-11-10 NOTE — Patient Instructions (Signed)

## 2013-11-10 NOTE — Progress Notes (Signed)
Amparo Bristol Date of Birth  06-May-1930       [facility]    [employer] [address], [place], [place]  [address]   [place], [place]  [address] [phone]     [phone]   Fax  [phone]    Fax [phone]  Problem List: Coronary artery disease- s/p CABG 2. Hypertension 3. Hyperlipidemia  History of Present Illness:  Ms. Heyward is a 80 yo with the above medical history.  She has been busy doing her yard work - no CP  Oct. 7, 2014:  Mrs. Syracuse had several episodes of syncope earlier this spring. She was referred for a stress Myoview study which was abnormal. She was admitted to the hospital. She had a cardiac catheterization which revealed patent grafts. The native right coronary artery was chronically occluded and filled via collaterals.   She has not had any further episodes of syncope.  She thinks she may have been dehydrated.    She has been able to do all of her normal activities without severe complication.  She does have chronic dyspnea .    November 10, 2013:  Heylee is doing well.  She is able to do all of her usual .  Her BP readings at home have been well controlled.     Current Outpatient Prescriptions on File Prior to Visit  Medication Sig Dispense Refill  . aspirin 81 MG tablet Take 1 tablet (81 mg total) by mouth daily.  30 tablet  6  . nitroGLYCERIN (NITROSTAT) 0.4 MG SL tablet Place 1 tablet (0.4 mg total) under the tongue every 5 (five) minutes as needed.  25 tablet  3  . rosuvastatin (CRESTOR) 20 MG tablet Take 1 tablet (20 mg total) by mouth daily.  90 tablet  3   No current facility-administered medications on file prior to visit.    No Known Allergies  Past Medical History  Diagnosis Date  . CAD (coronary artery disease)     Cardiac catheterization July 11, 2012, revealed severe native coronary disease patient's patent LIMA to LAD, SVG to die, SVG to OM. The RCA is chronically  and totally occluded.   . Hyperlipemia   . HTN (hypertension)     Past Surgical History  Procedure Laterality Date  . Coronary artery bypass graft  1996  . Coronary angioplasty    . Renal artery stent      History  Smoking status  . Never Smoker   Smokeless tobacco  . Not on file    History  Alcohol Use No    Family History  Problem Relation Age of Onset  . Heart attack Brother   . Heart attack Brother   . Heart failure Sister   . Coronary artery disease Sister   . Coronary artery disease Brother     had cabg    Reviw of Systems:  Reviewed in the HPI.  All other systems are negative.  Physical Exam: Blood pressure 140/80, pulse 76, height 5\' 1"  (1.549 m), weight 87 lb 6.4 oz (39.644 kg). General: Well developed, well nourished, in no acute distress.  Head: Normocephalic, atraumatic, sclera non-icteric, mucus membranes are moist,   Neck: Supple. Carotids are 2 + without bruits. No JVD  Lungs: Clear bilaterally to auscultation.  Heart: regular rate.  normal  S1 S2. No murmurs, gallops or rubs.  Abdomen: Soft, non-tender, non-distended with normal bowel sounds. No hepatomegaly. No rebound/guarding.  No masses.  Msk:  Strength and tone are normal  Extremities: No clubbing or cyanosis. No edema.  Distal pedal pulses are 2+ and equal bilaterally.  Neuro: Alert and oriented X 3. Moves all extremities spontaneously.  Psych:  Responds to questions appropriately with a normal affect.  ECG: Oct. 7, 2014:  NSR at 81 , NS ST /T abn.   Assessment / Plan:

## 2013-11-10 NOTE — Assessment & Plan Note (Signed)
We'll check fasting labs at her next visit

## 2013-11-10 NOTE — Assessment & Plan Note (Signed)
Sally Miller is doing well. Continue with same medications.    Her blood pressure is a little but lower-mostly because she's lost much weight. She has not needed lisinopril quite some time. She is to continue with the current dose of metoprolol.  I'll see her in 6 months

## 2014-07-06 NOTE — Consult Note (Signed)
PATIENT NAME:  Sally Miller, Sally Miller MR#:  [id number] DATE OF BIRTH:  1930/08/14  DATE OF CONSULTATION:  07/09/2012  CONSULTING PHYSICIAN:  Denice Bors. Crenshaw, MD  HISTORY OF PRESENT ILLNESS: The patient is a 81 year old female with a past medical history of hypertension, hyperlipidemia, coronary artery disease and nephrolithiasis, who I am asked to evaluate for a non-ST elevation myocardial infarction. The patient underwent coronary artery bypass graft approximately 18 years ago. This was performed in [place] and those records are not available. She had PCI approximately 8 years ago in [place]. She has had no cardiac issues since that time. Approximately 2 weeks ago, she had a syncopal episode. She states that she stood and walked to her counter. She then had a frank syncopal episode. There was no preceding dizziness, dyspnea, nausea or chest pain. There were no palpitations. She was unconscious for approximately 1 second. There was no incontinence or seizure activity. She otherwise denies dyspnea on exertion, orthopnea, PND, pedal edema, palpitations or exertional chest pain. Note, she did state that she had not been eating well for several days prior to her event. A nuclear study was ordered because of her syncope. This was performed yesterday. Her ejection fraction was 53%. She had significant ST depression with Lexiscan infusion. She also had chest tightness for 20 to 25 minutes. There appeared to be decreased perfusion in the basal inferior and inferoseptal region at stress and with rest. A small amount of ischemia could not be excluded. Her chest pain resolved, but she was sent to the Emergency Room and her troponin was abnormal. She has had no chest pain since her stress test and cardiology is now asked to evaluate.   PAST MEDICAL HISTORY: Significant for hypertension and hyperlipidemia. She had nephrolithiasis in the past. She has had a prior peptic ulcer. She has had coronary disease and has had prior  coronary artery bypass graft.   FAMILY HISTORY: Positive for coronary artery disease in her brothers and sisters.   SOCIAL HISTORY: She does not smoke, nor does she consume alcohol.   ALLERGIES: She has no known drug allergies.   [place]: Include heparin, aspirin, Zetia 10 mg daily, nasal spray, lisinopril 10 mg p.o. daily, metoprolol 25 mg p.o. b.i.d.   PHYSICAL EXAMINATION:  VITAL SIGNS: Blood pressure of 169/72 and a pulse of 87. She is afebrile.  GENERAL: She is well developed and somewhat frail. She is in no acute distress. Her skin is warm and dry. She does not to appear depressed and there is no peripheral clubbing. Her back is normal.  HEENT: Normal, with normal eyelids.  NECK: Supple with a normal upstroke bilaterally and I cannot appreciate bruits. There is no jugular venous distention and I cannot appreciate thyromegaly.  CHEST: Clear to auscultation with normal expansion.  CARDIOVASCULAR: Regular rate and rhythm with a normal S1 and S2. There are no murmurs, rubs or gallops noted.  ABDOMEN: Not tender or distended. Positive bowel sounds. No hepatosplenomegaly. No masses appreciated. There is no abdominal bruit.  EXTREMITIES: She has 2+ femoral pulses bilaterally, no bruits. Her extremities show no edema. I can palpate no cords. Her distal pulses are diminished.  NEUROLOGIC: Grossly intact.   LABORATORY, DIAGNOSTIC AND RADIOLOGICAL DATA: Chest x-ray showed no acute disease. Electrocardiogram showed sinus rhythm with nonspecific ST changes. Her sodium is 137 with potassium of 4.0. BUN and creatinine are 16 and 0.58. Glucose is 132. Her troponins are 0.02, 0.28 and 0.53. Her white blood cell count is 10.  Her hemoglobin is 12.9. Hematocrit is 39.1. Her platelet count is 164.   ASSESSMENT AND PLAN:  1.  Non-ST elevation myocardial infarction - The patient has ruled in for a non-ST elevation myocardial infarction following her nuclear study yesterday. She had  significant chest pain and electrocardiographic changes. I would recommend continuing aspirin, statin, beta-blockade and heparin. I would add Plavix 75 mg orally daily to her medical regimen. I have recommended cardiac catheterization on Monday for definitive evaluation. The risks including, but not limited to stroke, myocardial infarction and death were explained and the patient is somewhat hesitant. She would like to consider this overnight and we will discuss this again tomorrow morning. I also explained the risk of undiagnosed coronary disease including myocardial infarction and death.  2.  Syncope - The patient may have had syncope related to orthostasis. She had not eaten for several days leading up to her event. Her syncope occurred fairly quickly following standing. Her left ventricular function is normal on her nuclear study. If she has further episodes in the future, she may require a monitor.  3.  Hypertension - Continue present medications and increase as needed.  4.  Hyperlipidemia - Continue statin.    ____________________________ Denice Bors. Stanford Breed, MD bsc:jm D: 07/09/2012 13:23:05 ET T: 07/09/2012 14:15:26 ET JOB#: [id number]  cc: Denice Bors. Stanford Breed, MD, <Dictator> Lelon Perla MD ELECTRONICALLY SIGNED 08/25/2012 11:47

## 2014-07-06 NOTE — Discharge Summary (Signed)
PATIENT NAME:  KARLEN, BARBAR MR#:  [id number] DATE OF BIRTH:  02-11-1931  DATE OF ADMISSION:  07/09/2012 DATE OF DISCHARGE:  07/12/2012  PRIMARY CARDIOLOGIST: Dr. Acie Fredrickson of [facility]  PRIMARY CARE PHYSICIAN: Dr. Apolonio Schneiders   DISCHARGE DIAGNOSES: 1.  Non ST-elevation myocardial infarction.    2.  Hypertension.  3.  Hyperlipidemia.   CONSULTS: Drs. Thornell Sartorius of [facility].   DIAGNOSTIC AND RADIOLOGICAL DATA:  Imaging studies done include a chest x-ray which showed no acute abnormalities.  Outpatient test, a nuclear myocardial scan done on 07/08/2012, showed significant EKG changes with 2 mm ST depressions in V4 to V6 with symptoms of chest tightness. Also, imaging study showed decreased perfusion in the basal inferior and inferoseptal regions at stress and rest.   PROCEDURES DONE: Cardiac catheterization showed severe 3-vessel coronary artery disease with patent graft. RCA is chronically occluded and not bypassed, supplied by good left-to-right collaterals. Moderate in-stent restenosis in left circumflex artery.   ADMITTING HISTORY AND PHYSICAL: Please see detailed H and P dictated by Dr. Waldron Labs on 07/09/2012. In brief, this is a 81 year old female patient with prior history of coronary artery disease, coronary artery bypass grafting, hypertension, presented to the hospital sent in by Dr. Rockey Situ after a stress test was abnormal with ST depression in the lateral leads. The patient was admitted to the hospitalist service, was scheduled for a cardiac cath.   HOSPITAL COURSE: The patient had a cardiac cath which showed 3-vessel disease with patent graft, did have a mild in-stent restenosis of the left circumflex; but on discussing with Dr. Fletcher Anon and Dr. Rockey Situ, the patient was thought to be stable to be discharged home and follow up with Cardiology as outpatient. On the day of discharge, the patient does not have any chest pain, shortness of breath. Cardiac examination  showed S1 and S2 without any arrhythmias, and she is being discharged home in a fair condition.  DISCHARGE MEDICATIONS: Include: 1.  Aspirin 81 mg oral once a day.  2.  Crestor 10 mg oral once a day.  3.  Zetia 10 mg oral once a day.  4.  Metoprolol tartrate 25 mg oral 2 times a day.  5.  Lisinopril 10 mg oral once a day.  6.  Systane Ultra ophthalmic solution 2 drops into each eye 3 times a day as needed.  7.  Nasonex 2 sprays in each nostril once a day.  8.  Zyrtec 10 mg oral once a day as needed.  9.  Plavix 75 mg oral once a day.   DISCHARGE INSTRUCTIONS:  1.  The patient was discharged home on a low-sodium, low-fat, low-cholesterol diet.  2.  Activity as tolerated.  3.  Follow up with primary care physician, Dr. Acie Fredrickson of [facility], in 1 to 2 weeks.   TIME SPENT: On day of discharge in discharge activity was 40 minutes.   ____________________________ Leia Alf Hilda Rynders, MD srs:cb D: 07/12/2012 13:19:47 ET T: 07/12/2012 15:48:38 ET JOB#: [id number]  cc: Alveta Heimlich R. Debbrah Sampedro, MD, <Dictator> Mertie Moores, MD Vianne Bulls. Arline Asp, MD Neita Carp MD ELECTRONICALLY SIGNED 07/27/2012 13:52

## 2014-07-06 NOTE — H&P (Signed)
PATIENT NAME:  Sally Miller, Sally Miller MR#:  [id number] DATE OF BIRTH:  07/21/1930  DATE OF ADMISSION:  07/09/2012  REFERRING PHYSICIAN:  Dr. Marta Antu.   PRIMARY CARE PHYSICIAN:  Dr. Apolonio Schneiders.   PRIMARY CARDIOLOGIST:  Dr. Cathie Olden from [facility].   CHIEF COMPLAINT:  Chest pain.   HISTORY OF PRESENT ILLNESS:  This is a pleasant 81 year old female with significant past medical history of coronary artery disease, hyperlipidemia, hypertension, nephrolithiasis, status post stent and CABG in the past who presents with complaints of chest pain, the patient was getting scheduled stress test done by Dr. Rockey Situ today at his office where she developed chest tightness, as well she had ST depression in lateral leads V4 through V6, more than 2 mm, during the stress test, where she was sent to ED for further evaluation, the patient's daughter at bedside as well she gives most of the history.  She reports she gave her 2 of sublingual nitro where pain immediately subsided after sublingual nitro, since then the patient has been chest pain-free, anyhow the patient's vital signs were stable, she had an EKG done which did show T-wave inversion in V4 and V5, unfortunately there is no old EKG to compare, the patient's first troponin was essentially negative, but repeat troponins were trending up with last troponin being at 0.53, hospitalist service were requested to admit the patient for further work-up and management of her acute coronary syndrome, the patient was ordered 324 of aspirin and heparin drip in the ED, the patient denies any shortness of breath, any palpitation, any diaphoresis, weakness, lightheadedness, or altered mental status.   PAST MEDICAL HISTORY: 1.  Coronary artery disease, status post MI, status post a stent in 1997 with subsequent stent in 2003.  2.  Hyperlipidemia.  3.  Hypertension.  4.  Nephrolithiasis.   PAST SURGICAL HISTORY:  1.  Heart surgery, most likely bypass.  2.  Stent  placement.   HOME MEDICATIONS: 1.  Aspirin 81 mg oral daily.  3.  Lisinopril 10 mg oral daily.  4.  Metoprolol tartrate 25 mg by mouth twice daily.  5.  Nasonex two sprays at bedtime each nostril.   7.  Zetia 10 mg oral daily.  8.  Zyrtec 10 mg oral daily.   ALLERGIES:  No known drug allergies.   SOCIAL HISTORY:  Does not smoke or drink alcohol.   FAMILY HISTORY:  Significant for coronary artery disease.   REVIEW OF SYSTEMS:  CONSTITUTIONAL:  The patient denies fever, chills, fatigue, weakness.  EYES:  Denies blurry vision, double vision, pain or inflammation. EARS, NOSE, THROAT:  Denies tinnitus, ear pain, hearing loss.  RESPIRATORY:  Denies cough, wheezing, hemoptysis, COPD.  CARDIOVASCULAR:  Complains of chest pain/tightness, currently resolved.  Denies edema, arrhythmia, palpitations, syncope.  GASTROINTESTINAL:  Denies nausea, vomiting, diarrhea, abdominal pain, hematemesis, melena.  GENITOURINARY:  Denies dysuria, hematuria, renal colic.  ENDOCRINE:  Denies polyuria, polydipsia, heat or cold intolerance.  HEMATOLOGY:  Denies anemia, easy bruising, bleeding diathesis.  INTEGUMENTARY:  Denies acne, rash or skin lesions.  MUSCULOSKELETAL:  Denies neck pain, back pain, shoulder pain or gout.  NEUROLOGIC:  Denies numbness, dysarthria, epilepsy, tremors, vertigo, CVA or TIA.  PSYCHIATRIC:  Denies anxiety, insomnia, bipolar disorder or schizophrenia.   PHYSICAL EXAMINATION: VITAL SIGNS:  Temperature 97.7, pulse 90, respiratory rate 18, blood pressure 125/61, saturating 98% on oxygen.  GENERAL:  Frail, elderly female looks comfortable in bed in no apparent distress.  HEENT:  Head atraumatic, normocephalic.  Pupils equal,  reactive to light.  Pink conjunctivae.  Anicteric sclerae.  Moist oral mucosa.  NECK:  Supple.  No thyromegaly.  No JVD.  CHEST:  Good air entry bilaterally.  No wheezing, rales, rhonchi.  CARDIOVASCULAR:  S1, S2 heard.  No rubs, murmurs, gallops.  ABDOMEN:  Soft,  nontender, nondistended.  Bowel sounds present.  EXTREMITIES:  No edema.  No clubbing.  No cyanosis.  PSYCHIATRIC:  Appropriate affect.  Awake, alert x 3.  Intact judgment and insight.   NEUROLOGIC:  Cranial nerves grossly intact.  Motor 5 out of 5.  SKIN:  Normal skin turgor.  Warm and dry.   PERTINENT LABORATORY DATA:  Glucose 132, BUN 16, creatinine 0.58, sodium 137, potassium 4, chloride 103, CO2 28.  Troponin first one 0.02, second 0.28, third troponin 0.53.  White blood cells 10, hemoglobin 12.9, hematocrit 39.1, platelets 164.  EKG showing normal sinus rhythm at 85 beats per minute with ST depression in lateral leads.   ASSESSMENT AND PLAN: 1.  Acute coronary syndrome, the patient is known to have history of coronary artery disease, presents with complaints of chest pain/tightness during the stress test, the patient had abnormal stress test with ST depression in lateral leads and with abnormal perfusion in the thallium scan, as well had elevated troponins in ED, the patient will be given 324 mg of aspirin, will be started on heparin drip, she is already on lisinopril, Crestor and beta-blockers, will be admitted to the telemetry unit, we will consult cardiology service, Dr. Rockey Situ in a.m., already discussed with Dr. Harrington Challenger who is covering [facility].  2.  Hypertension, acceptable.  Continue with home medication.  3.  Hyperlipidemia.  Continue with Crestor and Zetia.  4.  Deep vein thrombosis prophylaxis.  The patient will be started on heparin drip for acute coronary syndrome.  5.  CODE STATUS:  THE PATIENT IS FULL CODE.   Total time spent on admission and patient care 60 minutes.    ____________________________ Albertine Patricia, MD dse:ea D: 07/09/2012 02:44:43 ET T: 07/09/2012 06:02:33 ET JOB#: [id number]  cc: Albertine Patricia, MD, <Dictator> Errol Ala Graciela Husbands MD ELECTRONICALLY SIGNED 07/10/2012 0:21

## 2014-07-12 ENCOUNTER — Encounter: Payer: Self-pay | Admitting: Cardiovascular Disease

## 2014-07-12 ENCOUNTER — Ambulatory Visit (INDEPENDENT_AMBULATORY_CARE_PROVIDER_SITE_OTHER): Payer: Medicare Other | Admitting: Cardiovascular Disease

## 2014-07-12 ENCOUNTER — Other Ambulatory Visit
Admit: 2014-07-12 | Disposition: A | Discharge status: Home / Self Care | Payer: Self-pay | Attending: Cardiovascular Disease | Admitting: Cardiovascular Disease

## 2014-07-12 VITALS — BP 142/80 | HR 70 | Ht 61.0 in | Wt 86.2 lb

## 2014-07-12 DIAGNOSIS — I25810 Atherosclerosis of coronary artery bypass graft(s) without angina pectoris: Secondary | ICD-10-CM

## 2014-07-12 DIAGNOSIS — E785 Hyperlipidemia, unspecified: Secondary | ICD-10-CM | POA: Diagnosis not present

## 2014-07-12 DIAGNOSIS — I10 Essential (primary) hypertension: Secondary | ICD-10-CM

## 2014-07-12 LAB — BASIC METABOLIC PANEL
Anion Gap: 5 — ABNORMAL LOW (ref 7–16)
BUN: 17 mg/dL
CALCIUM: 9.9 mg/dL
CO2: 30 mmol/L
Chloride: 106 mmol/L
Creatinine: 1.07 mg/dL — ABNORMAL HIGH
EGFR (African American): 56 — ABNORMAL LOW
GFR CALC NON AF AMER: 49 — AB
GLUCOSE: 91 mg/dL
POTASSIUM: 4.5 mmol/L
Sodium: 141 mmol/L

## 2014-07-12 LAB — HEPATIC FUNCTION PANEL A (ARMC)
ALK PHOS: 87 U/L
Albumin: 4.7 g/dL
BILIRUBIN TOTAL: 0.6 mg/dL
Bilirubin, Direct: <0.1 mg/dL
SGOT(AST): 29 U/L
SGPT (ALT): 18 U/L
TOTAL PROTEIN: 7.4 g/dL

## 2014-07-12 LAB — LIPID PANEL
Cholesterol: 208 mg/dL — ABNORMAL HIGH
HDL Cholesterol: 76 mg/dL
LDL CHOLESTEROL, CALC: 115 mg/dL — AB
TRIGLYCERIDES: 86 mg/dL
VLDL Cholesterol, Calc: 17 mg/dL

## 2014-07-12 NOTE — Patient Instructions (Signed)
Medication Instructions:  Your physician recommends that you continue on your current medications as directed. Please refer to the Current Medication list given to you today.   Labwork: Lipid, Liver, Bmet today  Testing/Procedures: None   Follow-Up: Your physician wants you to follow-up in: 6 months with Dr.Nahser You will receive a reminder letter in the mail two months in advance. If you don't receive a letter, please call our office to schedule the follow-up appointment.   Any Other Special Instructions Will Be Listed Below (If Applicable).

## 2014-07-12 NOTE — Progress Notes (Signed)
Cardiology Office Note   Date:  07/12/2014   ID:  Sally Miller, DOB 1930-04-18, MRN [id number]  PCP:  Marcello Fennel, MD  Cardiologist:   Thayer Headings, MD   Chief Complaint  Patient presents with  . Coronary Artery Disease    6 month follow up. Meds reviewed by the patient verbally. Needs cardiac clearance for cataract surgery.     Problem List  1.  Coronary artery disease- s/p CABG 2. Hypertension 3. Hyperlipidemia  History of Present Illness:  Ms. Sally Miller is a 81 yo with the above medical history. She has been busy doing her yard work - no CP  Oct. 7, 2014:  [name] had several episodes of syncope earlier this spring. She was referred for a stress Myoview study which was abnormal. She was admitted to the hospital. She had a cardiac catheterization which revealed patent grafts. The native right coronary artery was chronically occluded and filled via collaterals.   She has not had any further episodes of syncope. She thinks she may have been dehydrated. She has been able to do all of her normal activities without severe complication. She does have chronic dyspnea .   November 10, 2013:  Sally Miller is doing well. She is able to do all of her usual . Her BP readings at home have been well controlled.    July 12, 2014:   Sally Miller is a 81 y.o. female who presents for follow-up of her coronary artery disease. She needs to have cataract surgery and her doctor has requested cardiac clearance.   Past Medical History  Diagnosis Date  . CAD (coronary artery disease)     Cardiac catheterization July 11, 2012, revealed severe native coronary disease patient's patent LIMA to LAD, SVG to die, SVG to OM. The RCA is chronically and totally occluded.   . Hyperlipemia   . HTN (hypertension)     Past Surgical History  Procedure Laterality Date  . Coronary artery bypass graft  1996  . Coronary angioplasty    . Renal artery stent       Current Outpatient  Prescriptions  Medication Sig Dispense Refill  . aspirin 81 MG tablet Take 1 tablet (81 mg total) by mouth daily. 30 tablet 6  . metoprolol tartrate (LOPRESSOR) 25 MG tablet Take 25 mg by mouth daily.    . nitroGLYCERIN (NITROSTAT) 0.4 MG SL tablet Place 1 tablet (0.4 mg total) under the tongue every 5 (five) minutes as needed. 25 tablet 3  . rosuvastatin (CRESTOR) 20 MG tablet Take 1 tablet (20 mg total) by mouth daily. (Patient taking differently: Take 40 mg by mouth daily. ) 90 tablet 3   No current facility-administered medications for this visit.    Allergies:   Review of patient's allergies indicates no known allergies.    Social History:  The patient  reports that she has never smoked. She does not have any smokeless tobacco history on file. She reports that she does not drink alcohol or use illicit drugs.   Family History:  The patient's family history includes Coronary artery disease in her brother and sister; Heart attack in her brother and brother; Heart failure in her sister.    ROS:  Please see the history of present illness.    Review of Systems: Constitutional:  denies fever, chills, diaphoresis, appetite change and fatigue.  HEENT: denies photophobia, eye pain, redness, hearing loss, ear pain, congestion, sore throat, rhinorrhea, sneezing, neck pain, neck stiffness and tinnitus.  Respiratory:  denies SOB, DOE, cough, chest tightness, and wheezing.  Cardiovascular: denies chest pain, palpitations and leg swelling.  Gastrointestinal: denies nausea, vomiting, abdominal pain, diarrhea, constipation, blood in stool.  Genitourinary: denies dysuria, urgency, frequency, hematuria, flank pain and difficulty urinating.  Musculoskeletal: denies  myalgias, back pain, joint swelling, arthralgias and gait problem.   Skin: denies pallor, rash and wound.  Neurological: denies dizziness, seizures, syncope, weakness, light-headedness, numbness and headaches.   Hematological: denies  adenopathy, easy bruising, personal or family bleeding history.  Psychiatric/ Behavioral: denies suicidal ideation, mood changes, confusion, nervousness, sleep disturbance and agitation.       All other systems are reviewed and negative.    PHYSICAL EXAM: VS:  BP 142/80 mmHg  Pulse 70  Ht 5\' 1"  (1.549 m)  Wt 86 lb 4 oz (39.123 kg)  BMI 16.31 kg/m2 , BMI Body mass index is 16.31 kg/(m^2). GEN: Well nourished, well developed, in no acute distress HEENT: normal Neck: no JVD, carotid bruits, or masses Cardiac: RRR; no murmurs, rubs, or gallops,no edema  Respiratory:  clear to auscultation bilaterally, normal work of breathing GI: soft, nontender, nondistended, + BS MS: no deformity or atrophy Skin: warm and dry, no rash Neuro:  Strength and sensation are intact Psych: normal   EKG:  EKG is ordered today. The ekg ordered today demonstrates NSR at 70.  NS ST abn.    Recent Labs: No results found for requested labs within last 365 days.    Lipid Panel    Component Value Date/Time   CHOL 219* 06/23/2010 1104   TRIG 65.0 06/23/2010 1104   HDL 79.90 06/23/2010 1104   CHOLHDL 3 06/23/2010 1104   VLDL 13.0 06/23/2010 1104   LDLDIRECT 120.5 06/23/2010 1104      Wt Readings from Last 3 Encounters:  07/12/14 86 lb 4 oz (39.123 kg)  11/10/13 87 lb 6.4 oz (39.644 kg)  12/20/12 92 lb 8 oz (41.958 kg)      Other studies Reviewed: Additional studies/ records that were reviewed today include: . Review of the above records demonstrates:    ASSESSMENT AND PLAN:  1.  Coronary artery disease- s/p CABG -   she's doing very well. She's not having any episodes of angina. She is able to do her normal yard work and in fact yesterday she transplanted a tree.  2. Hypertension- BP is well controlled.   3. Hyperlipidemia - will check lipids, liver enzymes, BMP today .   4. Cataract-  She needs to have cataract surgery. I discussed with her the fact that this is a fairly simple  surgery. She's not having any angina. Her blood pressure is well-controlled. She should she is at low risk for any cardiac complications   Current medicines are reviewed at length with the patient today.  The patient does not have concerns regarding medicines.  The following changes have been made:  no change  Labs/ tests ordered today include:   Orders Placed This Encounter  Procedures  . Lipid panel  . Hepatic function panel  . Basic metabolic panel  . EKG 12-Lead     Disposition:   FU with me in 6 months .       Gwyn Hieronymus, Wonda Cheng, MD  07/12/2014 11:07 AM    [address], [place], [place]  [address] Phone: [phone]; Fax: [phone]   [facility]  [address] [place], [place]  [address] [phone]    Fax (  [phone]    

## 2015-02-06 ENCOUNTER — Encounter: Payer: Self-pay | Admitting: Cardiovascular Disease

## 2015-02-06 ENCOUNTER — Ambulatory Visit (INDEPENDENT_AMBULATORY_CARE_PROVIDER_SITE_OTHER): Payer: Medicare Other | Admitting: Cardiovascular Disease

## 2015-02-06 VITALS — BP 130/62 | HR 78 | Ht 61.0 in | Wt 87.2 lb

## 2015-02-06 DIAGNOSIS — I25810 Atherosclerosis of coronary artery bypass graft(s) without angina pectoris: Secondary | ICD-10-CM | POA: Diagnosis not present

## 2015-02-06 DIAGNOSIS — I159 Secondary hypertension, unspecified: Secondary | ICD-10-CM | POA: Diagnosis not present

## 2015-02-06 NOTE — Progress Notes (Signed)
Cardiology Office Note   Date:  02/06/2015   ID:  Sally Miller, DOB [birth year], MRN [id number]  PCP:  Sally Fennel, MD  Cardiologist:   Sally Headings, MD   Chief Complaint  Patient presents with  . Coronary Artery Disease    6 month follow up. "doing well."     Problem List  1.  Coronary artery disease- s/p CABG 2. Hypertension 3. Hyperlipidemia  History of Present Illness:  Sally Miller is a 81 yo with the above medical history. She has been busy doing her yard work - no CP  Oct. 7, 2014:  Sally Miller had several episodes of syncope earlier this spring. She was referred for a stress Myoview study which was abnormal. She was admitted to the hospital. She had a cardiac catheterization which revealed patent grafts. The native right coronary artery was chronically occluded and filled via collaterals.   She has not had any further episodes of syncope. She thinks she may have been dehydrated. She has been able to do all of her normal activities without severe complication. She does have chronic dyspnea .   November 10, 2013:  Sally Miller is doing well. She is able to do all of her usual . Her BP readings at home have been well controlled.    July 12, 2014:   Sally Miller is a 81 y.o. female who presents for follow-up of her coronary artery disease. She needs to have cataract surgery and her doctor has requested cardiac clearance.  Nov. 23, 2016: Doing well.  Has had eye surgery since I last saw her .     Past Medical History  Diagnosis Date  . CAD (coronary artery disease)     Cardiac catheterization July 11, 2012, revealed severe native coronary disease patient's patent LIMA to LAD, SVG to die, SVG to OM. The RCA is chronically and totally occluded.   . Hyperlipemia   . HTN (hypertension)     Past Surgical History  Procedure Laterality Date  . Coronary artery bypass graft  1996  . Coronary angioplasty    . Renal artery stent    . Cataract  extraction, bilateral       Current Outpatient Prescriptions  Medication Sig Dispense Refill  . aspirin 81 MG tablet Take 1 tablet (81 mg total) by mouth daily. 30 tablet 6  . metoprolol tartrate (LOPRESSOR) 25 MG tablet Take 25 mg by mouth daily.    . nitroGLYCERIN (NITROSTAT) 0.4 MG SL tablet Place 1 tablet (0.4 mg total) under the tongue every 5 (five) minutes as needed. 25 tablet 3  . rosuvastatin (CRESTOR) 40 MG tablet Take 40 mg by mouth daily.     No current facility-administered medications for this visit.    Allergies:   Review of patient's allergies indicates no known allergies.    Social History:  The patient  reports that she has never smoked. She does not have any smokeless tobacco history on file. She reports that she does not drink alcohol or use illicit drugs.   Family History:  The patient's family history includes Coronary artery disease in her brother and sister; Heart attack in her brother and brother; Heart failure in her sister.    ROS:  Please see the history of present illness.    Review of Systems: Constitutional:  denies fever, chills, diaphoresis, appetite change and fatigue.  HEENT: denies photophobia, eye pain, redness, hearing loss, ear pain, congestion, sore throat, rhinorrhea, sneezing, neck pain, neck stiffness and tinnitus.  Respiratory: denies SOB, DOE, cough, chest tightness, and wheezing.  Cardiovascular: denies chest pain, palpitations and leg swelling.  Gastrointestinal: denies nausea, vomiting, abdominal pain, diarrhea, constipation, blood in stool.  Genitourinary: denies dysuria, urgency, frequency, hematuria, flank pain and difficulty urinating.  Musculoskeletal: denies  myalgias, back pain, joint swelling, arthralgias and gait problem.   Skin: denies pallor, rash and wound.  Neurological: denies dizziness, seizures, syncope, weakness, light-headedness, numbness and headaches.   Hematological: denies adenopathy, easy bruising, personal or  family bleeding history.  Psychiatric/ Behavioral: denies suicidal ideation, mood changes, confusion, nervousness, sleep disturbance and agitation.       All other systems are reviewed and negative.    PHYSICAL EXAM: VS:  BP 130/62 mmHg  Pulse 78  Ht 5\' 1"  (1.549 m)  Wt 87 lb 4 oz (39.576 kg)  BMI 16.49 kg/m2 , BMI Body mass index is 16.49 kg/(m^2). GEN: Well nourished, well developed, in no acute distress HEENT: normal Neck: no JVD, carotid bruits, or masses Cardiac: RRR; no murmurs, rubs, or gallops,no edema  Respiratory:  clear to auscultation bilaterally, normal work of breathing GI: soft, nontender, nondistended, + BS MS: no deformity or atrophy Skin: warm and dry, no rash Neuro:  Strength and sensation are intact Psych: normal   EKG:  EKG is ordered today. The ekg ordered today demonstrates NSR at 70.  NS ST abn.    Recent Labs: 07/12/2014: BUN 17; Creatinine 1.07*; Potassium 4.5; SGPT (ALT) 18; Sodium 141    Lipid Panel    Component Value Date/Time   CHOL 208* 07/12/2014 1152   CHOL 219* 06/23/2010 1104   TRIG 86 07/12/2014 1152   TRIG 65.0 06/23/2010 1104   HDL 76 07/12/2014 1152   HDL 79.90 06/23/2010 1104   CHOLHDL 3 06/23/2010 1104   VLDL 17 07/12/2014 1152   VLDL 13.0 06/23/2010 1104   LDLCALC 115* 07/12/2014 1152   LDLDIRECT 120.5 06/23/2010 1104      Wt Readings from Last 3 Encounters:  02/06/15 87 lb 4 oz (39.576 kg)  07/12/14 86 lb 4 oz (39.123 kg)  11/10/13 87 lb 6.4 oz (39.644 kg)    ECG: 02/06/2015: Normal sinus rhythm at 78. His room occasional premature ventricular contractions. She has nonspecific ST changes in the anterior and lateral leads.  Other studies Reviewed: Additional studies/ records that were reviewed today include: . Review of the above records demonstrates:    ASSESSMENT AND PLAN:  1.  Coronary artery disease- s/p CABG -   she's doing very well. She's not having any episodes of angina. She is able to do her normal  yard work  She does lots of chores around her house.   2. Hypertension- BP is well controlled.   3. Hyperlipidemia - continue meds.   4. Cataract-  She needs to have cataract surgery. I discussed with her the fact that this is a fairly simple surgery. She's not having any angina. Her blood pressure is well-controlled. She should she is at low risk for any cardiac complications   Current medicines are reviewed at length with the patient today.  The patient does not have concerns regarding medicines.  The following changes have been made:  no change  Labs/ tests ordered today include:   Orders Placed This Encounter  Procedures  . EKG 12-Lead     Disposition:   FU with me in 6 months .       Raeqwon Lux, Wonda Cheng, MD  02/06/2015 11:30 AM    [facility] [place], [place], [place]  [address] Phone: [phone]; Fax: [phone]   [facility]  [address] [place], [place]  [address] [phone]    Fax [phone]

## 2015-02-06 NOTE — Patient Instructions (Signed)
Medication Instructions:  Your physician recommends that you continue on your current medications as directed. Please refer to the Current Medication list given to you today.   Labwork: Lipid, liver, BMET in 6 months  Testing/Procedures: none  Follow-Up: Your physician wants you to follow-up in: six months with Dr. Acie Fredrickson in [place]. You will receive a reminder letter in the mail two months in advance. If you don't receive a letter, please call our office to schedule the follow-up appointment.   Any Other Special Instructions Will Be Listed Below (If Applicable).     If you need a refill on your cardiac medications before your next appointment, please call your pharmacy.

## 2015-03-21 ENCOUNTER — Encounter: Payer: Self-pay | Admitting: Medical Oncology

## 2015-03-21 ENCOUNTER — Emergency Department: Payer: Medicare Other

## 2015-03-21 ENCOUNTER — Emergency Department
Admission: EM | Admit: 2015-03-21 | Discharge: 2015-03-21 | Disposition: A | Discharge status: Home / Self Care | Payer: Medicare Other | Attending: Emergency Medicine | Admitting: Emergency Medicine

## 2015-03-21 DIAGNOSIS — S61451A Open bite of right hand, initial encounter: Secondary | ICD-10-CM | POA: Diagnosis not present

## 2015-03-21 DIAGNOSIS — Z23 Encounter for immunization: Secondary | ICD-10-CM | POA: Diagnosis not present

## 2015-03-21 DIAGNOSIS — Z7982 Long term (current) use of aspirin: Secondary | ICD-10-CM | POA: Diagnosis not present

## 2015-03-21 DIAGNOSIS — W540XXA Bitten by dog, initial encounter: Secondary | ICD-10-CM | POA: Insufficient documentation

## 2015-03-21 DIAGNOSIS — Z79899 Other long term (current) drug therapy: Secondary | ICD-10-CM | POA: Diagnosis not present

## 2015-03-21 DIAGNOSIS — I10 Essential (primary) hypertension: Secondary | ICD-10-CM | POA: Diagnosis not present

## 2015-03-21 DIAGNOSIS — Y9389 Activity, other specified: Secondary | ICD-10-CM | POA: Diagnosis not present

## 2015-03-21 DIAGNOSIS — Y998 Other external cause status: Secondary | ICD-10-CM | POA: Diagnosis not present

## 2015-03-21 DIAGNOSIS — S61411A Laceration without foreign body of right hand, initial encounter: Secondary | ICD-10-CM

## 2015-03-21 DIAGNOSIS — Y9289 Other specified places as the place of occurrence of the external cause: Secondary | ICD-10-CM | POA: Diagnosis not present

## 2015-03-21 MED ORDER — CEPHALEXIN 500 MG PO CAPS: 500.0000 mg | ORAL_CAPSULE | Freq: Three times a day (TID) | ORAL | Status: AC

## 2015-03-21 MED ORDER — TETANUS-DIPHTH-ACELL PERTUSSIS 5-2.5-18.5 LF-MCG/0.5 IM SUSP
0.5000 mL | Freq: Once | INTRAMUSCULAR | Status: AC
  Administered 2015-03-21: 0.5 mL via INTRAMUSCULAR
  Filled 2015-03-21: qty 0.5

## 2015-03-21 NOTE — Discharge Instructions (Signed)
Animal Bite Animal bites can range from mild to serious. An animal bite can result in a scratch on the skin, a deep open cut, a puncture of the skin, a crush injury, or tearing away of the skin or a body part. A small bite from a house pet will usually not cause serious problems. However, some animal bites can become infected or injure a bone or other tissue.  Bites from certain animals can be more dangerous because of the risk of spreading rabies, which is a serious viral infection. This risk is higher with bites from stray animals or wild animals, such as raccoons, foxes, skunks, and bats. Dogs are responsible for most animal bites. Children are bitten more often than adults. SYMPTOMS  Common symptoms of an animal bite include:   Pain.   Bleeding.   Swelling.   Bruising.  DIAGNOSIS  This condition may be diagnosed based on a physical exam and medical history. Your health care provider will examine the wound and ask for details about the animal and how the bite happened. You may also have tests, such as:   Blood tests to check for infection or to determine if surgery is needed.  X-rays to check for damage to bones or joints.  Culture test. This uses a sample of fluid from the wound to check for infection. TREATMENT  Treatment varies depending on the location and type of animal bite and your medical history. Treatment may include:   Wound care. This often includes cleaning the wound, flushing the wound with saline solution, and applying a bandage (dressing). Sometimes, the wound is left open to heal because of the high risk of infection. However, in some cases, the wound may be closed with stitches (sutures), staples, skin glue, or adhesive strips.   Antibiotic medicine.   Tetanus shot.   Rabies treatment if the animal could have rabies.  In some cases, bites that have become infected may require IV antibiotics and surgical treatment in the hospital.  [place]  Follow instructions from your health care provider about how to take care of your wound. Make sure you:  Wash your hands with soap and water before you change your dressing. If soap and water are not available, use hand sanitizer.  Change your dressing as told by your health care provider.  Leave sutures, skin glue, or adhesive strips in place. These skin closures may need to be in place for 2 weeks or longer. If adhesive strip edges start to loosen and curl up, you may trim the loose edges. Do not remove adhesive strips completely unless your health care provider tells you to do that.  Check your wound every day for signs of infection. Watch for:   Increasing redness, swelling, or pain.   Fluid, blood, or pus.  General Instructions  Take or apply over-the-counter and prescription medicines only as told by your health care provider.   If you were prescribed an antibiotic, take or apply it as told by your health care provider. Do not stop using the antibiotic even if your condition improves.   Keep the injured area raised (elevated) above the level of your heart while you are sitting or lying down, if this is possible.   If directed, apply ice to the injured area.   Put ice in a plastic bag.   Place a towel between your skin and the bag.   Leave the ice on for 20 minutes, 2-3 times per day.  Keep all follow-up visits as told by your health care provider. This is important.  SEEK MEDICAL CARE IF:  You have increasing redness, swelling, or pain at the site of your wound.   You have a general feeling of sickness (malaise).   You feel nauseous or you vomit.   You have pain that does not get better.  SEEK IMMEDIATE MEDICAL CARE IF:  You have a red streak extending away from your wound.   You have fluid, blood, or pus coming from your wound.   You have a fever or chills.   You have trouble moving your injured area.   You  have numbness or tingling extending beyond the wound.   This information is not intended to replace advice given to you by your health care provider. Make sure you discuss any questions you have with your health care provider.   Document Released: 11/18/2010 Document Revised: 11/21/2014 Document Reviewed: 07/18/2014 Elsevier Interactive Patient Education 2016 [place], Adult A laceration is a cut that goes through all of the layers of the skin and into the tissue that is right under the skin. Some lacerations heal on their own. Others need to be closed with stitches (sutures), staples, skin adhesive strips, or skin glue. Proper laceration care minimizes the risk of infection and helps the laceration to heal better. HOW TO CARE FOR YOUR LACERATION If sutures or staples were used:  Keep the wound clean and dry.  If you were given a bandage (dressing), you should change it at least one time per day or as told by your health care provider. You should also change it if it becomes wet or dirty.  Keep the wound completely dry for the first 24 hours or as told by your health care provider. After that time, you may shower or bathe. However, make sure that the wound is not soaked in water until after the sutures or staples have been removed.  Clean the wound one time each day or as told by your health care provider:  Wash the wound with soap and water.  Rinse the wound with water to remove all soap.  Pat the wound dry with a clean towel. Do not rub the wound.  After cleaning the wound, apply a thin layer of antibiotic ointmentas told by your health care provider. This will help to prevent infection and keep the dressing from sticking to the wound.  Have the sutures or staples removed as told by your health care provider. If skin adhesive strips were used:  Keep the wound clean and dry.  If you were given a bandage (dressing), you should change it at least one time per  day or as told by your health care provider. You should also change it if it becomes dirty or wet.  Do not get the skin adhesive strips wet. You may shower or bathe, but be careful to keep the wound dry.  If the wound gets wet, pat it dry with a clean towel. Do not rub the wound.  Skin adhesive strips fall off on their own. You may trim the strips as the wound heals. Do not remove skin adhesive strips that are still stuck to the wound. They will fall off in time. If skin glue was used:  Try to keep the wound dry, but you may briefly wet it in the shower or bath. Do not soak the wound in water, such as by swimming.  After you have showered or bathed,  gently pat the wound dry with a clean towel. Do not rub the wound.  Do not do any activities that will make you sweat heavily until the skin glue has fallen off on its own.  Do not apply liquid, cream, or ointment medicine to the wound while the skin glue is in place. Using those may loosen the film before the wound has healed.  If you were given a bandage (dressing), you should change it at least one time per day or as told by your health care provider. You should also change it if it becomes dirty or wet.  If a dressing is placed over the wound, be careful not to apply tape directly over the skin glue. Doing that may cause the glue to be pulled off before the wound has healed.  Do not pick at the glue. The skin glue usually remains in place for 5-10 days, then it falls off of the skin. General Instructions  Take over-the-counter and prescription medicines only as told by your health care provider.  If you were prescribed an antibiotic medicine or ointment, take or apply it as told by your doctor. Do not stop using it even if your condition improves.  To help prevent scarring, make sure to cover your wound with sunscreen whenever you are outside after stitches are removed, after adhesive strips are removed, or when glue remains in place and  the wound is healed. Make sure to wear a sunscreen of at least 30 SPF.  Do not scratch or pick at the wound.  Keep all follow-up visits as told by your health care provider. This is important.  Check your wound every day for signs of infection. Watch for:  Redness, swelling, or pain.  Fluid, blood, or pus.  Raise (elevate) the injured area above the level of your heart while you are sitting or lying down, if possible. SEEK MEDICAL CARE IF:  You received a tetanus shot and you have swelling, severe pain, redness, or bleeding at the injection site.  You have a fever.  A wound that was closed breaks open.  You notice a bad smell coming from your wound or your dressing.  You notice something coming out of the wound, such as wood or glass.  Your pain is not controlled with medicine.  You have increased redness, swelling, or pain at the site of your wound.  You have fluid, blood, or pus coming from your wound.  You notice a change in the color of your skin near your wound.  You need to change the dressing frequently due to fluid, blood, or pus draining from the wound.  You develop a new rash.  You develop numbness around the wound. SEEK IMMEDIATE MEDICAL CARE IF:  You develop severe swelling around the wound.  Your pain suddenly increases and is severe.  You develop painful lumps near the wound or on skin that is anywhere on your body.  You have a red streak going away from your wound.  The wound is on your hand or foot and you cannot properly move a finger or toe.  The wound is on your hand or foot and you notice that your fingers or toes look pale or bluish.   This information is not intended to replace advice given to you by your health care provider. Make sure you discuss any questions you have with your health care provider.   Document Released: 03/02/2005 Document Revised: 07/17/2014 Document Reviewed: 02/26/2014 Elsevier Interactive Patient Education 2016  Elsevier Inc[employer] ° °

## 2015-03-21 NOTE — ED Notes (Signed)
Pts own dog bit her on her rt hand. Bleeding controlled. Dogs shots UTD. Pt unsure of her own tetanus status.

## 2015-03-21 NOTE — ED Notes (Signed)
States her dog bit her today  Small area noted to top of right hand

## 2015-03-21 NOTE — ED Provider Notes (Signed)
[facility] Emergency Department Provider Note ____________________________________________  Time seen: Approximately 5:46 PM  I have reviewed the triage vital signs and the nursing notes.   HISTORY  Chief Complaint Animal Bite   HPI Sally Miller is a 82 y.o. female who presents to the emergency department for evaluation of a dog bite. She states that it was her dog that bit her and its immunizations are all up-to-date. She will need an updated tetanus shot.   Past Medical History  Diagnosis Date  . CAD (coronary artery disease)     Cardiac catheterization July 11, 2012, revealed severe native coronary disease patient's patent LIMA to LAD, SVG to die, SVG to OM. The RCA is chronically and totally occluded.   . Hyperlipemia   . HTN (hypertension)     Patient Active Problem List   Diagnosis Date Noted  . CAD (coronary artery disease) 06/23/2010  . Hypertension 06/23/2010  . Hyperlipidemia 06/23/2010  . Fatigue 06/23/2010    Past Surgical History  Procedure Laterality Date  . Coronary artery bypass graft  1996  . Coronary angioplasty    . Renal artery stent    . Cataract extraction, bilateral      Current Outpatient Rx  Name  Route  Sig  Dispense  Refill  . aspirin 81 MG tablet   Oral   Take 1 tablet (81 mg total) by mouth daily.   30 tablet   6   . cephALEXin (KEFLEX) 500 MG capsule   Oral   Take 1 capsule (500 mg total) by mouth 3 (three) times daily.   30 capsule   0   . metoprolol tartrate (LOPRESSOR) 25 MG tablet   Oral   Take 25 mg by mouth daily.         . nitroGLYCERIN (NITROSTAT) 0.4 MG SL tablet   Sublingual   Place 1 tablet (0.4 mg total) under the tongue every 5 (five) minutes as needed.   25 tablet   3   . rosuvastatin (CRESTOR) 40 MG tablet   Oral   Take 40 mg by mouth daily.           Allergies Review of patient's allergies indicates no known allergies.  Family History  Problem Relation Age of Onset   . Heart attack Brother   . Heart attack Brother   . Heart failure Sister   . Coronary artery disease Sister   . Coronary artery disease Brother     had cabg    Social History Social History  Substance Use Topics  . Smoking status: Never Smoker   . Smokeless tobacco: None  . Alcohol Use: No    Review of Systems   Constitutional: No fever/chills Eyes: No visual changes. ENT: No congestion or rhinorrhea Cardiovascular: Denies chest pain. Respiratory: Denies shortness of breath. Gastrointestinal: No abdominal pain.  No nausea, no vomiting.  No diarrhea.  No constipation. Genitourinary: Negative for dysuria. Musculoskeletal: Negative for back pain. Skin: Positive for laceration and puncture wounds to the right hand. Neurological: Negative for headaches, focal weakness or numbness.  10-point ROS otherwise negative.  ____________________________________________   PHYSICAL EXAM:  VITAL SIGNS: ED Triage Vitals  Enc Vitals Group     BP 03/21/15 1722 191/71 mmHg     Pulse Rate 03/21/15 1722 83     Resp 03/21/15 1722 18     Temp 03/21/15 1722 98.1 F (36.7 C)     Temp Source 03/21/15 1722 Oral     SpO2 03/21/15  1722 98 %     Weight 03/21/15 1722 87 lb (39.463 kg)     Height 03/21/15 1722 5\' 1"  (1.549 m)     Head Cir --      Peak Flow --      Pain Score 03/21/15 1723 3     Pain Loc --      Pain Edu? --      Excl. in [place]? --     Constitutional: Alert and oriented. Well appearing and in no acute distress. Eyes: Conjunctivae are normal. PERRL. EOMI. Head: Atraumatic. Nose: No congestion/rhinnorhea. Mouth/Throat: Mucous membranes are moist.  Oropharynx non-erythematous. No oral lesions. Neck: No stridor. Cardiovascular: Normal rate, regular rhythm.  Good peripheral circulation. Respiratory: Normal respiratory effort.  No retractions. Lungs CTAB. Gastrointestinal: Soft and nontender. No distention. No abdominal bruits.  Musculoskeletal: No lower extremity tenderness  nor edema.  No joint effusions. Neurologic:  Normal speech and language. No gross focal neurologic deficits are appreciated. Speech is normal. No gait instability. Skin:  3 cm laceration noted to the web space of the first and second finger on the right hand, puncture wound noted to the palmar aspect as well; contusion noted in the area of the bite on the right hand Psychiatric: Mood and affect are normal. Speech and behavior are normal.  ____________________________________________   LABS (all labs ordered are listed, but only abnormal results are displayed)  Labs Reviewed - No data to display ____________________________________________  EKG   ____________________________________________  RADIOLOGY  Right hand x-ray negative for acute bony abnormality or foreign body per radiology. ____________________________________________   PROCEDURES  Procedure(s) performed:   Hand soaked in Betadine normal saline. Wound loosely reapproximated with Steri-Strips. ____________________________________________   INITIAL IMPRESSION / ASSESSMENT AND PLAN / ED COURSE  Pertinent labs & imaging results that were available during my care of the patient were reviewed by me and considered in my medical decision making (see chart for details).  Wound care discussed with patient and family. She is to schedule a follow-up appointment early next week to see her primary care provider for wound check. She is to follow-up sooner or return to the emergency department for any sign or concern of infection. She'll be given a prescription today for Keflex. She was instructed to take it until finished and as prescribed. ____________________________________________   FINAL CLINICAL IMPRESSION(S) / ED DIAGNOSES  Final diagnoses:  Animal bite of hand, right, initial encounter  Laceration of hand without complication, excluding fingers, right, initial encounter       Victorino Dike, FNP 03/21/15  [id number]  Carrie Mew, MD 03/21/15 2329

## 2015-07-09 ENCOUNTER — Ambulatory Visit: Payer: Medicare Other | Admitting: Cardiovascular Disease

## 2015-08-07 ENCOUNTER — Ambulatory Visit: Payer: Medicare Other | Admitting: Cardiovascular Disease

## 2015-09-18 ENCOUNTER — Encounter: Payer: Self-pay | Admitting: Cardiovascular Disease

## 2015-09-18 ENCOUNTER — Ambulatory Visit (INDEPENDENT_AMBULATORY_CARE_PROVIDER_SITE_OTHER): Payer: Medicare Other | Admitting: Cardiovascular Disease

## 2015-09-18 VITALS — BP 125/65 | HR 70 | Ht 61.0 in | Wt 89.8 lb

## 2015-09-18 DIAGNOSIS — E785 Hyperlipidemia, unspecified: Secondary | ICD-10-CM

## 2015-09-18 DIAGNOSIS — I10 Essential (primary) hypertension: Secondary | ICD-10-CM | POA: Diagnosis not present

## 2015-09-18 DIAGNOSIS — I25810 Atherosclerosis of coronary artery bypass graft(s) without angina pectoris: Secondary | ICD-10-CM

## 2015-09-18 NOTE — Progress Notes (Signed)
Cardiology Office Note   Date:  09/18/2015   ID:  Sally Miller, DOB Feb 17, 1931, MRN [id number]  PCP:  Sally Fennel, MD  Cardiologist:   Sally Moores, MD   Chief Complaint  Patient presents with  . Coronary Artery Disease    Problem List  1.  Coronary artery disease- s/p CABG 2. Hypertension 3. Hyperlipidemia  History of Present Illness:  Sally Miller is a 82 yo with the above medical history. She has been busy doing her yard work - no CP  Oct. 7, 2014:  Sally Miller had several episodes of syncope earlier this spring. She was referred for a stress Myoview study which was abnormal. She was admitted to the hospital. She had a cardiac catheterization which revealed patent grafts. The native right coronary artery was chronically occluded and filled via collaterals.   She has not had any further episodes of syncope. She thinks she may have been dehydrated. She has been able to do all of her normal activities without severe complication. She does have chronic dyspnea .   November 10, 2013:  Sally Miller is doing well. She is able to do all of her usual . Her BP readings at home have been well controlled.    July 12, 2014:   Sally Miller is a 82 y.o. female who presents for follow-up of her coronary artery disease. She needs to have cataract surgery and her doctor has requested cardiac clearance.  Nov. 23, 2016: Doing well.  Has had eye surgery since I last saw her .   September 18, 2015:  Doing well. A bit anxious today . Leaving for [facility] tomorrow .  May eat a bit more salt than she should  No CP or dyspnea     Past Medical History  Diagnosis Date  . CAD (coronary artery disease)     Cardiac catheterization July 11, 2012, revealed severe native coronary disease patient's patent LIMA to LAD, SVG to die, SVG to OM. The RCA is chronically and totally occluded.   . Hyperlipemia   . HTN (hypertension)     Past Surgical History  Procedure Laterality Date  .  Coronary artery bypass graft  1996  . Coronary angioplasty    . Renal artery stent    . Cataract extraction, bilateral       Current Outpatient Prescriptions  Medication Sig Dispense Refill  . aspirin 81 MG tablet Take 1 tablet (81 mg total) by mouth daily. 30 tablet 6  . metoprolol tartrate (LOPRESSOR) 25 MG tablet Take 25 mg by mouth daily.    . nitroGLYCERIN (NITROSTAT) 0.4 MG SL tablet Place 1 tablet (0.4 mg total) under the tongue every 5 (five) minutes as needed. 25 tablet 3  . rosuvastatin (CRESTOR) 40 MG tablet Take 40 mg by mouth daily.     No current facility-administered medications for this visit.    Allergies:   Review of patient's allergies indicates no known allergies.    Social History:  The patient  reports that she has never smoked. She does not have any smokeless tobacco history on file. She reports that she does not drink alcohol or use illicit drugs.   Family History:  The patient's family history includes Coronary artery disease in her brother and sister; Heart attack in her brother and brother; Heart failure in her sister.    ROS:  Please see the history of present illness.    Review of Systems: Constitutional:  denies fever, chills, diaphoresis, appetite change and  fatigue.  HEENT: denies photophobia, eye pain, redness, hearing loss, ear pain, congestion, sore throat, rhinorrhea, sneezing, neck pain, neck stiffness and tinnitus.  Respiratory: denies SOB, DOE, cough, chest tightness, and wheezing.  Cardiovascular: denies chest pain, palpitations and leg swelling.  Gastrointestinal: denies nausea, vomiting, abdominal pain, diarrhea, constipation, blood in stool.  Genitourinary: denies dysuria, urgency, frequency, hematuria, flank pain and difficulty urinating.  Musculoskeletal: denies  myalgias, back pain, joint swelling, arthralgias and gait problem.   Skin: denies pallor, rash and wound.  Neurological: denies dizziness, seizures, syncope, weakness,  light-headedness, numbness and headaches.   Hematological: denies adenopathy, easy bruising, personal or family bleeding history.  Psychiatric/ Behavioral: denies suicidal ideation, mood changes, confusion, nervousness, sleep disturbance and agitation.       All other systems are reviewed and negative.    PHYSICAL EXAM: VS:  BP 125/65 mmHg  Pulse 70  Ht 5\' 1"  (1.549 m)  Wt 89 lb 12.8 oz (40.733 kg)  BMI 16.98 kg/m2 , BMI Body mass index is 16.98 kg/(m^2). GEN: Well nourished, well developed, in no acute distress HEENT: normal Neck: no JVD, carotid bruits, or masses Cardiac: RRR; no murmurs, rubs, or gallops,no edema  Respiratory:  clear to auscultation bilaterally, normal work of breathing GI: soft, nontender, nondistended, + BS MS: no deformity or atrophy Skin: warm and dry, no rash Neuro:  Strength and sensation are intact Psych: normal   EKG:  EKG is ordered today. The ekg ordered today demonstrates NSR at 70.  NS ST abn.    Recent Labs: No results found for requested labs within last 365 days.    Lipid Panel    Component Value Date/Time   CHOL 208* 07/12/2014 1152   CHOL 219* 06/23/2010 1104   TRIG 86 07/12/2014 1152   TRIG 65.0 06/23/2010 1104   HDL 76 07/12/2014 1152   HDL 79.90 06/23/2010 1104   CHOLHDL 3 06/23/2010 1104   VLDL 17 07/12/2014 1152   VLDL 13.0 06/23/2010 1104   LDLCALC 115* 07/12/2014 1152   LDLDIRECT 120.5 06/23/2010 1104      Wt Readings from Last 3 Encounters:  09/18/15 89 lb 12.8 oz (40.733 kg)  03/21/15 87 lb (39.463 kg)  02/06/15 87 lb 4 oz (39.576 kg)    ECG: 02/06/2015: Normal sinus rhythm at 78. His room occasional premature ventricular contractions. She has nonspecific ST changes in the anterior and lateral leads.  Other studies Reviewed: Additional studies/ records that were reviewed today include: . Review of the above records demonstrates:    ASSESSMENT AND PLAN:  1.  Coronary artery disease- s/p CABG -   she's  doing very well. She's not having any episodes of angina. She is able to do her normal yard work  She does lots of chores around her house.   2. Hypertension- BP is well controlled.   3. Hyperlipidemia - continue meds.   4. Cataract-    Current medicines are reviewed at length with the patient today.  The patient does not have concerns regarding medicines.  The following changes have been made:  no change  Labs/ tests ordered today include:   No orders of the defined types were placed in this encounter.    Disposition:   FU with me in 6 months .     Sally Moores, MD  09/18/2015 4:39 PM    [address], [place], [place]  [address] Phone: [phone]; Fax: [phone]   [employer]  [phone]  [address] [place], [place]  [address] [phone]    Fax [phone]

## 2015-09-18 NOTE — Patient Instructions (Signed)
Medication Instructions:  Your physician recommends that you continue on your current medications as directed. Please refer to the Current Medication list given to you today.  Labwork: No new orders.   Testing/Procedures: No new orders.   Follow-Up: Your physician wants you to follow-up in: 6 MONTHS with Dr Acie Fredrickson.  You will receive a reminder letter in the mail two months in advance. If you don't receive a letter, please call our office to schedule the follow-up appointment.   Any Other Special Instructions Will Be Listed Below (If Applicable).     If you need a refill on your cardiac medications before your next appointment, please call your pharmacy.

## 2016-04-22 ENCOUNTER — Encounter: Payer: Self-pay | Admitting: Emergency Medicine

## 2016-04-22 ENCOUNTER — Emergency Department: Payer: Medicare Other

## 2016-04-22 ENCOUNTER — Observation Stay
Admission: EM | Admit: 2016-04-22 | Discharge: 2016-04-24 | Disposition: A | Discharge status: Home / Self Care | Payer: Medicare Other | Attending: Internal Medicine | Admitting: Internal Medicine

## 2016-04-22 DIAGNOSIS — Z9842 Cataract extraction status, left eye: Secondary | ICD-10-CM | POA: Diagnosis not present

## 2016-04-22 DIAGNOSIS — R748 Abnormal levels of other serum enzymes: Secondary | ICD-10-CM | POA: Insufficient documentation

## 2016-04-22 DIAGNOSIS — Z95828 Presence of other vascular implants and grafts: Secondary | ICD-10-CM | POA: Insufficient documentation

## 2016-04-22 DIAGNOSIS — R7989 Other specified abnormal findings of blood chemistry: Secondary | ICD-10-CM

## 2016-04-22 DIAGNOSIS — I214 Non-ST elevation (NSTEMI) myocardial infarction: Secondary | ICD-10-CM | POA: Diagnosis not present

## 2016-04-22 DIAGNOSIS — Z7982 Long term (current) use of aspirin: Secondary | ICD-10-CM | POA: Diagnosis not present

## 2016-04-22 DIAGNOSIS — R079 Chest pain, unspecified: Secondary | ICD-10-CM | POA: Diagnosis present

## 2016-04-22 DIAGNOSIS — Z8249 Family history of ischemic heart disease and other diseases of the circulatory system: Secondary | ICD-10-CM | POA: Diagnosis not present

## 2016-04-22 DIAGNOSIS — I2510 Atherosclerotic heart disease of native coronary artery without angina pectoris: Secondary | ICD-10-CM | POA: Insufficient documentation

## 2016-04-22 DIAGNOSIS — I252 Old myocardial infarction: Secondary | ICD-10-CM | POA: Diagnosis not present

## 2016-04-22 DIAGNOSIS — Z951 Presence of aortocoronary bypass graft: Secondary | ICD-10-CM | POA: Diagnosis not present

## 2016-04-22 DIAGNOSIS — Z955 Presence of coronary angioplasty implant and graft: Secondary | ICD-10-CM | POA: Insufficient documentation

## 2016-04-22 DIAGNOSIS — Z9889 Other specified postprocedural states: Secondary | ICD-10-CM | POA: Insufficient documentation

## 2016-04-22 DIAGNOSIS — Z91041 Radiographic dye allergy status: Secondary | ICD-10-CM | POA: Diagnosis not present

## 2016-04-22 DIAGNOSIS — I160 Hypertensive urgency: Secondary | ICD-10-CM | POA: Diagnosis not present

## 2016-04-22 DIAGNOSIS — I10 Essential (primary) hypertension: Secondary | ICD-10-CM

## 2016-04-22 DIAGNOSIS — E785 Hyperlipidemia, unspecified: Secondary | ICD-10-CM

## 2016-04-22 DIAGNOSIS — J449 Chronic obstructive pulmonary disease, unspecified: Secondary | ICD-10-CM | POA: Insufficient documentation

## 2016-04-22 DIAGNOSIS — Z79899 Other long term (current) drug therapy: Secondary | ICD-10-CM | POA: Diagnosis not present

## 2016-04-22 DIAGNOSIS — Z7902 Long term (current) use of antithrombotics/antiplatelets: Secondary | ICD-10-CM | POA: Diagnosis not present

## 2016-04-22 DIAGNOSIS — Z9841 Cataract extraction status, right eye: Secondary | ICD-10-CM | POA: Diagnosis not present

## 2016-04-22 DIAGNOSIS — M6281 Muscle weakness (generalized): Secondary | ICD-10-CM

## 2016-04-22 DIAGNOSIS — I25810 Atherosclerosis of coronary artery bypass graft(s) without angina pectoris: Secondary | ICD-10-CM

## 2016-04-22 DIAGNOSIS — R778 Other specified abnormalities of plasma proteins: Secondary | ICD-10-CM

## 2016-04-22 HISTORY — DX: Syncope and collapse: R55

## 2016-04-22 HISTORY — DX: Acute myocardial infarction, unspecified: I21.9

## 2016-04-22 LAB — TROPONIN I
TROPONIN I: 0.36 ng/mL — AB (ref <0.03)
Troponin I: <0.03 ng/mL (ref <0.03)
Troponin I: 0.53 ng/mL (ref <0.03)

## 2016-04-22 LAB — BASIC METABOLIC PANEL
ANION GAP: 7 (ref 5–15)
BUN: 24 mg/dL — ABNORMAL HIGH (ref 6–20)
CHLORIDE: 101 mmol/L (ref 101–111)
CO2: 30 mmol/L (ref 22–32)
Calcium: 9.5 mg/dL (ref 8.9–10.3)
Creatinine, Ser: 0.98 mg/dL (ref 0.44–1.00)
GFR calc Af Amer: >60 mL/min (ref >60)
GFR calc non Af Amer: 52 mL/min — ABNORMAL LOW (ref >60)
Glucose, Bld: 148 mg/dL — ABNORMAL HIGH (ref 65–99)
POTASSIUM: 3.5 mmol/L (ref 3.5–5.1)
Sodium: 138 mmol/L (ref 135–145)

## 2016-04-22 LAB — CBC
HEMATOCRIT: 38.1 % (ref 35.0–47.0)
HEMOGLOBIN: 13.1 g/dL (ref 12.0–16.0)
MCH: 31.8 pg (ref 26.0–34.0)
MCHC: 34.3 g/dL (ref 32.0–36.0)
MCV: 92.7 fL (ref 80.0–100.0)
Platelets: 164 10*3/uL (ref 150–440)
RBC: 4.11 MIL/uL (ref 3.80–5.20)
RDW: 13.5 % (ref 11.5–14.5)
WBC: 6.4 10*3/uL (ref 3.6–11.0)

## 2016-04-22 MED ORDER — FLUTICASONE PROPIONATE 50 MCG/ACT NA SUSP
1.0000 | Freq: Every day | NASAL | Status: DC | PRN
  Filled 2016-04-22: qty 16

## 2016-04-22 MED ORDER — SODIUM CHLORIDE 0.9% FLUSH
3.0000 mL | Freq: Two times a day (BID) | INTRAVENOUS | Status: DC
  Administered 2016-04-22 – 2016-04-23 (×2): 3 mL via INTRAVENOUS

## 2016-04-22 MED ORDER — CLOPIDOGREL BISULFATE 75 MG PO TABS
75.0000 mg | ORAL_TABLET | Freq: Every day | ORAL | Status: DC
  Administered 2016-04-22 – 2016-04-24 (×3): 75 mg via ORAL
  Filled 2016-04-22 (×3): qty 1

## 2016-04-22 MED ORDER — ADULT MULTIVITAMIN W/MINERALS CH
1.0000 | ORAL_TABLET | Freq: Every day | ORAL | Status: DC
  Administered 2016-04-23 – 2016-04-24 (×2): 1 via ORAL
  Filled 2016-04-22 (×2): qty 1

## 2016-04-22 MED ORDER — ONDANSETRON HCL 4 MG PO TABS: 4.0000 mg | ORAL_TABLET | Freq: Four times a day (QID) | ORAL | Status: DC | PRN

## 2016-04-22 MED ORDER — BISACODYL 5 MG PO TBEC: 5.0000 mg | DELAYED_RELEASE_TABLET | Freq: Every day | ORAL | Status: DC | PRN

## 2016-04-22 MED ORDER — DOCUSATE SODIUM 100 MG PO CAPS
100.0000 mg | ORAL_CAPSULE | Freq: Two times a day (BID) | ORAL | Status: DC
  Administered 2016-04-22 – 2016-04-23 (×3): 100 mg via ORAL
  Filled 2016-04-22 (×3): qty 1

## 2016-04-22 MED ORDER — ENOXAPARIN SODIUM 30 MG/0.3ML ~~LOC~~ SOLN
30.0000 mg | SUBCUTANEOUS | Status: DC
  Administered 2016-04-22: 30 mg via SUBCUTANEOUS
  Filled 2016-04-22: qty 0.3

## 2016-04-22 MED ORDER — ASPIRIN EC 81 MG PO TBEC
81.0000 mg | DELAYED_RELEASE_TABLET | Freq: Every day | ORAL | Status: DC
  Administered 2016-04-23 – 2016-04-24 (×2): 81 mg via ORAL
  Filled 2016-04-22 (×2): qty 1

## 2016-04-22 MED ORDER — METOPROLOL TARTRATE 25 MG PO TABS
25.0000 mg | ORAL_TABLET | Freq: Every day | ORAL | Status: DC
  Administered 2016-04-22 – 2016-04-23 (×2): 25 mg via ORAL
  Filled 2016-04-22 (×2): qty 1

## 2016-04-22 MED ORDER — SODIUM CHLORIDE 0.9% FLUSH
3.0000 mL | INTRAVENOUS | Status: DC | PRN
Start: 2016-04-23 — End: 2016-04-24

## 2016-04-22 MED ORDER — ACETAMINOPHEN 650 MG RE SUPP: 650.0000 mg | Freq: Four times a day (QID) | RECTAL | Status: DC | PRN

## 2016-04-22 MED ORDER — NITROGLYCERIN 2 % TD OINT
0.5000 [in_us] | TOPICAL_OINTMENT | Freq: Four times a day (QID) | TRANSDERMAL | Status: DC
  Administered 2016-04-22 – 2016-04-23 (×2): 0.5 [in_us] via TOPICAL
  Filled 2016-04-22 (×2): qty 1

## 2016-04-22 MED ORDER — ACETAMINOPHEN 325 MG PO TABS: 650.0000 mg | ORAL_TABLET | Freq: Four times a day (QID) | ORAL | Status: DC | PRN

## 2016-04-22 MED ORDER — ONDANSETRON HCL 4 MG/2ML IJ SOLN: 4.0000 mg | Freq: Four times a day (QID) | INTRAMUSCULAR | Status: DC | PRN

## 2016-04-22 MED ORDER — TRAZODONE HCL 50 MG PO TABS: 25.0000 mg | ORAL_TABLET | Freq: Every evening | ORAL | Status: DC | PRN

## 2016-04-22 MED ORDER — NITROGLYCERIN 0.4 MG SL SUBL: 0.4000 mg | SUBLINGUAL_TABLET | SUBLINGUAL | Status: DC | PRN

## 2016-04-22 MED ORDER — ROSUVASTATIN CALCIUM 20 MG PO TABS
40.0000 mg | ORAL_TABLET | Freq: Every day | ORAL | Status: DC
  Administered 2016-04-22 – 2016-04-24 (×3): 40 mg via ORAL
  Filled 2016-04-22 (×2): qty 2
  Filled 2016-04-22: qty 1
  Filled 2016-04-22: qty 2

## 2016-04-22 NOTE — ED Notes (Signed)
Pt assisted to bathroom

## 2016-04-22 NOTE — Progress Notes (Signed)
Anticoagulation monitoring(Lovenox):  83 yo female ordered Lovenox 40 mg Q24h  Filed Weights   04/22/16 1358  Weight: 88 lb (39.9 kg)   BMI    Lab Results  Component Value Date   CREATININE 0.98 04/22/2016   CREATININE 1.07 (H) 07/12/2014   CREATININE 0.58 (L) 07/11/2012   Estimated Creatinine Clearance: 27.4 mL/min (by C-G formula based on SCr of 0.98 mg/dL). Hemoglobin & Hematocrit     Component Value Date/Time   HGB 13.1 04/22/2016 1403   HGB 13.0 07/11/2012 0340   HCT 38.1 04/22/2016 1403   HCT 38.5 07/11/2012 0340     Per Protocol for Patient with estCrcl < 30 ml/min and BMI < 40, will transition to Lovenox 30 mg Q24h.

## 2016-04-22 NOTE — ED Notes (Signed)
Dr Paduchowski notified of elevated troponin 

## 2016-04-22 NOTE — ED Triage Notes (Signed)
Pt comes into the ED via POV c/o chest pain that started earlier today.  Patient has h/o MI in the past.  Patient states it feels like chest tightness.  Patient took 2 nitro and 3 baby aspirin at home with minimal relief.  Patient in NAD at this time with even and unlabored respirations.  Patient denies any shortness of breath, dizziness, or N/V.  Patient explains that she was having pain radiate to her back.

## 2016-04-22 NOTE — H&P (Signed)
[facility] NAME: Sally Miller    MR#:  [id number]  DATE OF BIRTH:  [birth year]  DATE OF ADMISSION:  04/22/2016  PRIMARY CARE PHYSICIAN: BABAOFF, Caryl Bis, MD   REQUESTING/REFERRING PHYSICIAN: Dr.Kevin Paduchowski CHIEF COMPLAINT:   Chief Complaint  Patient presents with  . Chest Pain    HISTORY OF PRESENT ILLNESS:  Sally Miller  is a 83 y.o. female with a known history of Coronary artery disease status post CABG comes in because of chest pain. Noticed chest pain this afternoon midsternal region associated with shortness of breath, sweating. Did not hear any nausea or vomiting. Patient had diaphoresis. She took aspirin, nitrates with relief of symptoms. Physician spoke to [facility] cardiology on-call who recommended admission because of her history of CAD. PAST MEDICAL HISTORY:   Past Medical History:  Diagnosis Date  . CAD (coronary artery disease)    Cardiac catheterization July 11, 2012, revealed severe native coronary disease patient's patent LIMA to LAD, SVG to die, SVG to OM. The RCA is chronically and totally occluded.   Marland Kitchen HTN (hypertension)   . Hyperlipemia   . MI (myocardial infarction)     PAST SURGICAL HISTOIRY:   Past Surgical History:  Procedure Laterality Date  . CATARACT EXTRACTION, BILATERAL    . CORONARY ANGIOPLASTY    . CORONARY ARTERY BYPASS GRAFT  1996  . RENAL ARTERY STENT      SOCIAL HISTORY:   Social History  Substance Use Topics  . Smoking status: Never Smoker  . Smokeless tobacco: Never Used  . Alcohol use No    FAMILY HISTORY:   Family History  Problem Relation Age of Onset  . Heart attack Brother   . Heart attack Brother   . Heart failure Sister   . Coronary artery disease Sister   . Coronary artery disease Brother     had cabg    DRUG ALLERGIES:   Allergies  Allergen Reactions  . Other     Dye used for chemical stress test.     REVIEW OF SYSTEMS:   CONSTITUTIONAL: No fever, fatigue or weakness.  EYES: No blurred or double vision.  EARS, NOSE, AND THROAT: No tinnitus or ear pain.  RESPIRATORY: No cough, shortness of breath, wheezing or hemoptysis.  CARDIOVASCULAR: chest pain/sob.  GASTROINTESTINAL: No nausea, vomiting, diarrhea or abdominal pain.  GENITOURINARY: No dysuria, hematuria.  ENDOCRINE: No polyuria, nocturia,  HEMATOLOGY: No anemia, easy bruising or bleeding SKIN: No rash or lesion. MUSCULOSKELETAL: No joint pain or arthritis.   NEUROLOGIC: No tingling, numbness, weakness.  PSYCHIATRY: No anxiety or depression.   MEDICATIONS AT HOME:   Prior to Admission medications   Medication Sig Start Date End Date Taking? Authorizing Provider  aspirin 81 MG tablet Take 1 tablet (81 mg total) by mouth daily. 12/26/12  Yes Thayer Headings, MD  metoprolol tartrate (LOPRESSOR) 25 MG tablet Take 25 mg by mouth daily. 12/26/12  Yes Thayer Headings, MD  Multiple Vitamin (MULTIVITAMIN) capsule Take 1 capsule by mouth daily.   Yes Historical Provider, MD  nitroGLYCERIN (NITROSTAT) 0.4 MG SL tablet Place 1 tablet (0.4 mg total) under the tongue every 5 (five) minutes as needed. 12/26/12  Yes Thayer Headings, MD  Olopatadine HCl 0.2 % SOLN Place 1 drop into both eyes daily.  04/12/16  Yes Historical Provider, MD  rosuvastatin (CRESTOR) 40 MG tablet Take 40 mg by mouth daily.   Yes Historical Provider, MD  fluticasone (  FLONASE) 50 MCG/ACT nasal spray Place 1 spray into both nostrils daily. 01/21/16   Historical Provider, MD      VITAL SIGNS:  Blood pressure (!) 170/59, pulse 83, temperature 98.2 F (36.8 C), temperature source Oral, resp. rate 16, height 5\' 1"  (1.549 m), weight 39.9 kg (88 lb), SpO2 98 %.  PHYSICAL EXAMINATION:  GENERAL:  83 y.o.-year-old patient lying in the bed with no acute distress.  EYES: Pupils equal, round, reactive to light and accommodation. No scleral icterus. Extraocular muscles intact.  HEENT: Head atraumatic,  normocephalic. Oropharynx and nasopharynx clear.  NECK:  Supple, no jugular venous distention. No thyroid enlargement, no tenderness.  LUNGS: Normal breath sounds bilaterally, no wheezing, rales,rhonchi or crepitation. No use of accessory muscles of respiration.  CARDIOVASCULAR: S1, S2 normal. No murmurs, rubs, or gallops.  ABDOMEN: Soft, nontender, nondistended. Bowel sounds present. No organomegaly or mass.  EXTREMITIES: No pedal edema, cyanosis, or clubbing.  NEUROLOGIC: Cranial nerves II through XII are intact. Muscle strength 5/5 in all extremities. Sensation intact. Gait not checked.  PSYCHIATRIC: The patient is alert and oriented x 3.  SKIN: No obvious rash, lesion, or ulcer.   LABORATORY PANEL:   CBC  Recent Labs Lab 04/22/16 1403  WBC 6.4  HGB 13.1  HCT 38.1  PLT 164   ------------------------------------------------------------------------------------------------------------------  Chemistries   Recent Labs Lab 04/22/16 1403  NA 138  K 3.5  CL 101  CO2 30  GLUCOSE 148*  BUN 24*  CREATININE 0.98  CALCIUM 9.5   ------------------------------------------------------------------------------------------------------------------  Cardiac Enzymes  Recent Labs Lab 04/22/16 1403  TROPONINI <0.03   ------------------------------------------------------------------------------------------------------------------  RADIOLOGY:  Dg Chest 2 View  Result Date: 04/22/2016 CLINICAL DATA:  Chest pain and tightness EXAM: CHEST  2 VIEW COMPARISON:  07/08/2012 FINDINGS: Cardiac shadow is within normal limits. The lungs are hyperinflated bilaterally. No focal infiltrate or sizable effusion is seen. Postsurgical changes are again noted. Patchy scarring is noted in the right upper lobe stable from the previous exam. No acute bony abnormality is noted. IMPRESSION: COPD without acute abnormality. Electronically Signed   By: Inez Catalina M.D.   On: 04/22/2016 14:40    EKG:    Orders placed or performed during the hospital encounter of 04/22/16  . EKG 12-Lead  . EKG 12-Lead  . ED EKG within 10 minutes  . ED EKG within 10 minutes   Normal  sinus rhythm  at   85 bpm with sinus arrhythmia, T-wave inversions in lead V4, V5, V6.Marland Kitchen IMPRESSION AND PLAN:   1 chest pain with the patient with the 100 artery disease status post CABG, PCI and symptoms improved with nitroglycerin: Possible angina: First set of troponins negative, cycle troponin, cardiology is consulted. Continue aspirin, plavix, beta blockers, high intensity statins, nitrates, if second troponin Cosopt start the patient on heparin drip. Cardiology consult with [facility] cardiology.  D/w pt and daughter.  All the records are reviewed and case discussed with ED provider. Management plans discussed with the patient, family and they are in agreement.  CODE STATUS:full  TOTAL TIME TAKING CARE OF THIS PATIENT: 55 minutes.    Epifanio Lesches M.D on 04/22/2016 at 5:01 PM  Between 7am to 6pm - Pager - [phone]  After 6pm go to [email or web address] - password [email or web address] [facility]  Office  [phone]  CC: Primary care physician; BABAOFF, Caryl Bis, MD  Note: This dictation was prepared with Dragon dictation along with smaller phrase technology. Any transcriptional errors that result from this process  are unintentional.

## 2016-04-22 NOTE — ED Notes (Signed)
Attempted to call report and was told that nurse taking pt was in a pt room and would call me back

## 2016-04-22 NOTE — ED Notes (Signed)
Pt started with chest pain on the left side that radiated into left shoulder blade and back approx 12noon - at that time pain was 10/10 - at this time denies any pain or shortness of breath - color is race appropriate and respirations are even and unlabored

## 2016-04-22 NOTE — ED Provider Notes (Signed)
[facility] Emergency Department Provider Note  Time seen: 4:48 PM  I have reviewed the triage vital signs and the nursing notes.   HISTORY  Chief Complaint Chest Pain    HPI Sally Miller is a 83 y.o. female with a past medical history of hypertension, hyperlipidemia, CAD status post CABG in the 1990s, has had 2 stents placed 2003, 2004, presents to the emergency department for chest pain. According to the patient around 12 PM today she developed sudden onset of chest pain/tightness along with shortness of breath and diaphoresis. Patient took 2 nitroglycerin tablets and 4 baby aspirin and came to the hospital. Within 1 hour of chest pain onset her symptoms had resolved. This is the first time the patient has had chest discomfort in 3 or 4 years per patient. Currently the patient is symptom-free denies any shortness of breath nausea or chest pain.  Past Medical History:  Diagnosis Date  . CAD (coronary artery disease)    Cardiac catheterization July 11, 2012, revealed severe native coronary disease patient's patent LIMA to LAD, SVG to die, SVG to OM. The RCA is chronically and totally occluded.   Marland Kitchen HTN (hypertension)   . Hyperlipemia   . MI (myocardial infarction)     Patient Active Problem List   Diagnosis Date Noted  . Chest pain 04/22/2016  . CAD (coronary artery disease) 06/23/2010  . Hypertension 06/23/2010  . Hyperlipidemia 06/23/2010  . Fatigue 06/23/2010    Past Surgical History:  Procedure Laterality Date  . CATARACT EXTRACTION, BILATERAL    . CORONARY ANGIOPLASTY    . CORONARY ARTERY BYPASS GRAFT  1996  . RENAL ARTERY STENT      Prior to Admission medications   Medication Sig Start Date End Date Taking? Authorizing Provider  aspirin 81 MG tablet Take 1 tablet (81 mg total) by mouth daily. 12/26/12  Yes Thayer Headings, MD  metoprolol tartrate (LOPRESSOR) 25 MG tablet Take 25 mg by mouth daily. 12/26/12  Yes Thayer Headings, MD  Multiple  Vitamin (MULTIVITAMIN) capsule Take 1 capsule by mouth daily.   Yes Historical Provider, MD  nitroGLYCERIN (NITROSTAT) 0.4 MG SL tablet Place 1 tablet (0.4 mg total) under the tongue every 5 (five) minutes as needed. 12/26/12  Yes Thayer Headings, MD  Olopatadine HCl 0.2 % SOLN Place 1 drop into both eyes daily.  04/12/16  Yes Historical Provider, MD  rosuvastatin (CRESTOR) 40 MG tablet Take 40 mg by mouth daily.   Yes Historical Provider, MD  fluticasone (FLONASE) 50 MCG/ACT nasal spray Place 1 spray into both nostrils daily. 01/21/16   Historical Provider, MD    Allergies  Allergen Reactions  . Other     Dye used for chemical stress test.     Family History  Problem Relation Age of Onset  . Heart attack Brother   . Heart attack Brother   . Heart failure Sister   . Coronary artery disease Sister   . Coronary artery disease Brother     had cabg    Social History Social History  Substance Use Topics  . Smoking status: Never Smoker  . Smokeless tobacco: Never Used  . Alcohol use No    Review of Systems Constitutional: Negative for fever. Cardiovascular: Positive for chest pain now resolved. Respiratory: Positive for shortness of breath now resolved Gastrointestinal: Negative for abdominal pain, vomiting and diarrhea. Genitourinary: Negative for dysuria. Neurological: Negative for headache 10-point ROS otherwise negative.  ____________________________________________   PHYSICAL EXAM:  VITAL SIGNS: ED Triage Vitals  Enc Vitals Group     BP 04/22/16 1358 (!) 170/59     Pulse Rate 04/22/16 1358 83     Resp 04/22/16 1358 16     Temp 04/22/16 1358 98.2 F (36.8 C)     Temp Source 04/22/16 1358 Oral     SpO2 04/22/16 1358 98 %     Weight 04/22/16 1358 88 lb (39.9 kg)     Height 04/22/16 1358 5\' 1"  (1.549 m)     Head Circumference --      Peak Flow --      Pain Score 04/22/16 1404 8     Pain Loc --      Pain Edu? --      Excl. in [place]? --     Constitutional: Alert  and oriented. Well appearing and in no distress. Eyes: Normal exam ENT   Head: Normocephalic and atraumatic.   Mouth/Throat: Mucous membranes are moist. Cardiovascular: Normal rate, regular rhythm.  Respiratory: Normal respiratory effort without tachypnea nor retractions. Breath sounds are clear Gastrointestinal: Soft and nontender. No distention.   Musculoskeletal: Nontender with normal range of motion in all extremities. No lower extremity edema or tenderness. Neurologic:  Normal speech and language. No gross focal neurologic deficits Skin:  Skin is warm, dry and intact.  Psychiatric: Mood and affect are normal.   ____________________________________________    EKG  EKG reviewed and interpreted by myself shows normal sinus rhythm at 85 bpm, narrow QRS, normal axis, normal intervals, nonspecific ST changes without ST elevation. Largely unchanged from prior EKG.  ____________________________________________    RADIOLOGY  Chest x-ray negative for acute abnormality.  ____________________________________________   INITIAL IMPRESSION / ASSESSMENT AND PLAN / ED COURSE  Pertinent labs & imaging results that were available during my care of the patient were reviewed by me and considered in my medical decision making (see chart for details).  The patient presents to the emergency Department for concerning chest pain. Patient has a significant coronary history. I discussed the patient with her cardiology team, Dr. Saunders Revel, who recommends admission to the hospital for further workup given the patient's significant history and concerning symptoms. Patient is agreeable to this plan.  ____________________________________________   FINAL CLINICAL IMPRESSION(S) / ED DIAGNOSES  Chest pain    Harvest Dark, MD 04/22/16 [phone]

## 2016-04-23 ENCOUNTER — Observation Stay (HOSPITAL_BASED_OUTPATIENT_CLINIC_OR_DEPARTMENT_OTHER)
Admit: 2016-04-23 | Discharge: 2016-04-23 | Disposition: A | Discharge status: Home / Self Care | Payer: Medicare Other | Attending: Nurse Practitioner | Admitting: Nurse Practitioner

## 2016-04-23 ENCOUNTER — Encounter: Payer: Self-pay | Admitting: Nurse Practitioner

## 2016-04-23 DIAGNOSIS — I10 Essential (primary) hypertension: Secondary | ICD-10-CM

## 2016-04-23 DIAGNOSIS — R748 Abnormal levels of other serum enzymes: Secondary | ICD-10-CM | POA: Diagnosis not present

## 2016-04-23 DIAGNOSIS — R079 Chest pain, unspecified: Secondary | ICD-10-CM | POA: Diagnosis not present

## 2016-04-23 DIAGNOSIS — I208 Other forms of angina pectoris: Secondary | ICD-10-CM

## 2016-04-23 DIAGNOSIS — E782 Mixed hyperlipidemia: Secondary | ICD-10-CM

## 2016-04-23 DIAGNOSIS — I25118 Atherosclerotic heart disease of native coronary artery with other forms of angina pectoris: Secondary | ICD-10-CM | POA: Diagnosis not present

## 2016-04-23 DIAGNOSIS — R7989 Other specified abnormal findings of blood chemistry: Secondary | ICD-10-CM

## 2016-04-23 DIAGNOSIS — Z951 Presence of aortocoronary bypass graft: Secondary | ICD-10-CM

## 2016-04-23 DIAGNOSIS — R778 Other specified abnormalities of plasma proteins: Secondary | ICD-10-CM

## 2016-04-23 DIAGNOSIS — I214 Non-ST elevation (NSTEMI) myocardial infarction: Secondary | ICD-10-CM | POA: Diagnosis not present

## 2016-04-23 LAB — TROPONIN I: TROPONIN I: 0.35 ng/mL — AB (ref <0.03)

## 2016-04-23 LAB — BASIC METABOLIC PANEL
Anion gap: 6 (ref 5–15)
BUN: 18 mg/dL (ref 6–20)
CALCIUM: 9.2 mg/dL (ref 8.9–10.3)
CO2: 26 mmol/L (ref 22–32)
CREATININE: 0.84 mg/dL (ref 0.44–1.00)
Chloride: 105 mmol/L (ref 101–111)
GFR calc Af Amer: >60 mL/min (ref >60)
Glucose, Bld: 69 mg/dL (ref 65–99)
POTASSIUM: 3.7 mmol/L (ref 3.5–5.1)
SODIUM: 137 mmol/L (ref 135–145)

## 2016-04-23 LAB — CBC
HEMATOCRIT: 36.5 % (ref 35.0–47.0)
Hemoglobin: 12.4 g/dL (ref 12.0–16.0)
MCH: 31.5 pg (ref 26.0–34.0)
MCHC: 34 g/dL (ref 32.0–36.0)
MCV: 92.7 fL (ref 80.0–100.0)
PLATELETS: 157 10*3/uL (ref 150–440)
RBC: 3.93 MIL/uL (ref 3.80–5.20)
RDW: 13.7 % (ref 11.5–14.5)
WBC: 6.2 10*3/uL (ref 3.6–11.0)

## 2016-04-23 LAB — PROTIME-INR
INR: 1.03
PROTHROMBIN TIME: 13.5 s (ref 11.4–15.2)

## 2016-04-23 LAB — APTT: APTT: 36 s (ref 24–36)

## 2016-04-23 LAB — ECHOCARDIOGRAM COMPLETE
Height: 61 in
Weight: 1358.03 oz

## 2016-04-23 LAB — GLUCOSE, CAPILLARY: Glucose-Capillary: 104 mg/dL — ABNORMAL HIGH (ref 65–99)

## 2016-04-23 MED ORDER — HEPARIN (PORCINE) IN NACL 100-0.45 UNIT/ML-% IJ SOLN
450.0000 [IU]/h | INTRAMUSCULAR | Status: DC
  Administered 2016-04-23: 450 [IU]/h via INTRAVENOUS
  Filled 2016-04-23: qty 250

## 2016-04-23 MED ORDER — METOPROLOL TARTRATE 25 MG PO TABS
25.0000 mg | ORAL_TABLET | Freq: Two times a day (BID) | ORAL | Status: DC
  Administered 2016-04-23 – 2016-04-24 (×2): 25 mg via ORAL
  Filled 2016-04-23 (×2): qty 1

## 2016-04-23 MED ORDER — ISOSORBIDE MONONITRATE ER 30 MG PO TB24
30.0000 mg | ORAL_TABLET | Freq: Every day | ORAL | Status: DC
  Administered 2016-04-23: 30 mg via ORAL
  Filled 2016-04-23: qty 1

## 2016-04-23 MED ORDER — HEPARIN BOLUS VIA INFUSION
2300.0000 [IU] | Freq: Once | INTRAVENOUS | Status: AC
  Administered 2016-04-23: 2300 [IU] via INTRAVENOUS
  Filled 2016-04-23: qty 2300

## 2016-04-23 NOTE — Progress Notes (Signed)
ANTICOAGULATION CONSULT NOTE - Follow Up Consult  Pharmacy Consult for heparin drip Indication: chest pain/ACS  Allergies  Allergen Reactions  . Other     Dye used for chemical stress test.     Patient Measurements: Height: 5\' 1"  (154.9 cm) Weight: 84 lb 14 oz (38.5 kg) IBW/kg (Calculated) : 47.8 Heparin Dosing Weight: 38.5kg  Vital Signs: Temp: 98 F (36.7 C) (02/08 0357) Temp Source: Oral (02/08 0357) BP: 133/50 (02/08 0357) Pulse Rate: 77 (02/08 0357)  Labs:  Recent Labs  04/22/16 1403 04/22/16 1749 04/22/16 2057 04/23/16 0307 04/23/16 0508  HGB 13.1  --   --  12.4  --   HCT 38.1  --   --  36.5  --   PLT 164  --   --  157  --   APTT  --   --   --   --  36  LABPROT  --   --   --   --  13.5  INR  --   --   --   --  1.03  CREATININE 0.98  --   --  0.84  --   TROPONINI <0.03 0.36* 0.53* 0.35*  --     Estimated Creatinine Clearance: 30.8 mL/min (by C-G formula based on SCr of 0.84 mg/dL).   Medications:  No anticoag in PTA meds.  Assessment:  Goal of Therapy:  Heparin level 0.3-0.7 units/ml Monitor platelets by anticoagulation protocol: Yes   Plan:  2300 unit bolus and initial rate of 450 units/hr. First heparin level 8 hours after start of infusion.  Sally Miller S 04/23/2016,6:50 AM

## 2016-04-23 NOTE — Progress Notes (Signed)
Initial Nutrition Assessment  DOCUMENTATION CODES:   Non-severe (moderate) malnutrition in context of chronic illness  INTERVENTION:  1. Diet advancement per MD/NP/PA 2. Ensure Enlive po BID, each supplement provides 350 kcal and 20 grams of protein w/ diet advancement  NUTRITION DIAGNOSIS:   Malnutrition related to chronic illness as evidenced by moderate depletion of body fat, moderate depletions of muscle mass.  GOAL:   Patient will meet greater than or equal to 90% of their needs  MONITOR:   PO intake, I & O's, Labs, Weight trends, Supplement acceptance  REASON FOR ASSESSMENT:   Other (Comment) (Low BMI)    ASSESSMENT:   Sally Miller  is a 83 y.o. female with a known history of Coronary artery disease status post CABG comes in because of chest pain. Noticed chest pain this afternoon midsternal region associated with shortness of breath, sweating  Spoke with Sally Miller, daughter at bedside. They report ok appetite PTA. Patient normally eats a light meal for breakfast - cereal or crackers and a complete meal (starch, protein, vegetable) for Dinner. Reported UBW of 87# - currently exhibits a 5#/5.6% insignificant wt loss over 7 months. Does not consume oral nutrition supplements. Denies any nausea/vomiting/diarrhea, reports perceived constipation "I feel I need to go, but I can't." Denies any issues chewing/swallowing Currently NPO - did not ave breakfast this morning. Nutrition-Focused physical exam completed. Findings are moderate-severe fat depletion, moderate-severe muscle depletion, and no edema.   Labs and medications reviewed: Colace, MVI w/ Minerals Heparin gtt  Diet Order:  Diet NPO time specified  Skin:  Reviewed, no issues  Last BM:  04/22/2016  Height:   Ht Readings from Last 1 Encounters:  04/22/16 5\' 1"  (1.549 m)    Weight:   Wt Readings from Last 1 Encounters:  04/23/16 84 lb 14 oz (38.5 kg)    Ideal Body Weight:  47.72 kg  BMI:  Body mass  index is 16.04 kg/m.  Estimated Nutritional Needs:   Kcal:  1000-1300 calories  Protein:  38-46 gm  Fluid:  >/= 1L  EDUCATION NEEDS:   No education needs identified at this time  Sally Miller. Sally Biederman, MS, RD LDN Inpatient Clinical Dietitian Pager [phone]

## 2016-04-23 NOTE — Consult Note (Addendum)
Cardiology Consult    Patient ID: Sally Miller MRN: [id number], DOB/AGE: 03-24-1930   Admit date: 04/22/2016 Date of Consult: 04/23/2016  Primary Physician: Marcello Fennel, MD Primary Cardiologist: Joaquim Nam, MD Requesting Provider: R. Redwood  Patient Profile    83 y/o ? with a h/o CAD s/p CABG, HTN, HL, and syncope, who was admitted 2/7 with chest pain and trop elevation.  Past Medical History   Past Medical History:  Diagnosis Date  . CAD (coronary artery disease)    a. S/P prior LAD/LCX stenting;  b. S/P prior CABG x 3; c. 06/2012 Cath: LM 20, LAD 100p, 39m ISR, LCX 20p ISR, 88m ISR, RCA 100 CTO, patent LIMA to LAD, SVG to diag2, SVG to OM2.   Marland Kitchen HTN (hypertension)   . Hyperlipemia   . MI (myocardial infarction)   . Syncope    a. 06/2012.    Past Surgical History:  Procedure Laterality Date  . CATARACT EXTRACTION, BILATERAL    . CORONARY ANGIOPLASTY    . CORONARY ARTERY BYPASS GRAFT  1996  . RENAL ARTERY STENT       Allergies  Allergies  Allergen Reactions  . Other     Dye used for chemical stress test.     History of Present Illness    83 y/o ? with a h /o CAD s/p prior LAD and LCX stenting followed by 3 vessel CABG.  She also has a h/o HTN, HL, and syncope.  Her last cath was performed in 06/2012 revealing 3/3 patent grafts with a CTO of the RCA, occluded prox LAD, and moderate nonobstructive LCX dzs.  She has been medically managed ever since and had done quite well.  She is fairly active @ home and has not had to take a ntg "in over three years."  She was in her usoh until the early afternoon of 2/7, when she had just awakened from a nap and noted moderate substernal chest pressure and heaviness associated with dyspnea and diaphoresis.  She took a ntg, which did not immediately help and so thirty minutes later, she took another one and had her dtr drive her to the ED.  En route to the ED, she had complete resolution of c/p.  Upon arrival, initial troponin was  normal, and ecg was non-acute.  BP was elevated @ 170/59.  She was subsequently admitted and has since r/i for NSTEMI with a peak trop of 0.53.  She has had no further chest pain.  Trop is now trending down @ 0.35.  She says that symptoms on 2/7 were similar to prior angina.  Inpatient Medications    . aspirin EC  81 mg Oral Daily  . clopidogrel  75 mg Oral Daily  . docusate sodium  100 mg Oral BID  . isosorbide mononitrate  30 mg Oral Daily  . metoprolol tartrate  25 mg Oral BID  . multivitamin with minerals  1 tablet Oral Daily  . rosuvastatin  40 mg Oral Daily  . sodium chloride flush  3 mL Intravenous Q12H    Family History    Family History  Problem Relation Age of Onset  . Heart attack Brother   . Heart attack Brother   . Heart failure Sister   . Coronary artery disease Sister   . Coronary artery disease Brother     had cabg    Social History    Social History   Social History  . Marital status: Widowed    Spouse  name: N/A  . Number of children: N/A  . Years of education: N/A   Occupational History  . Not on file.   Social History Main Topics  . Smoking status: Never Smoker  . Smokeless tobacco: Never Used  . Alcohol use No  . Drug use: No  . Sexual activity: Not on file   Other Topics Concern  . Not on file   Social History Narrative  . No narrative on file     Review of Systems    General:  No chills, fever, night sweats or weight changes.  Cardiovascular:  +++ chest pain, +++ dyspnea, no edema, orthopnea, palpitations, paroxysmal nocturnal dyspnea. Dermatological: No rash, lesions/masses Respiratory: No cough, +++ dyspnea in setting of c/p 2/7. Urologic: No hematuria, dysuria Abdominal:   No nausea, vomiting, diarrhea, bright red blood per rectum, melena, or hematemesis Neurologic:  No visual changes, wkns, changes in mental status. All other systems reviewed and are otherwise negative except as noted above.  Physical Exam    Blood pressure  108/64, pulse 81, temperature 97.9 F (36.6 C), temperature source Oral, resp. rate 16, height 5\' 1"  (1.549 m), weight 84 lb 14 oz (38.5 kg), SpO2 94 %.  General: Pleasant, NAD Psych: Normal affect. Neuro: Alert and oriented X 3. Moves all extremities spontaneously. HEENT: Normal  Neck: Supple without bruits or JVD. Lungs:  Resp regular and unlabored, CTA. Heart: RRR no s3, s4, or murmurs. Abdomen: Soft, non-tender, non-distended, BS + x 4.  Extremities: No clubbing, cyanosis or edema. DP/PT/Radials 2+ and equal bilaterally.  Labs     Recent Labs  04/22/16 1403 04/22/16 1749 04/22/16 2057 04/23/16 0307  TROPONINI <0.03 0.36* 0.53* 0.35*   Lab Results  Component Value Date   WBC 6.2 04/23/2016   HGB 12.4 04/23/2016   HCT 36.5 04/23/2016   MCV 92.7 04/23/2016   PLT 157 04/23/2016     Recent Labs Lab 04/23/16 0307  NA 137  K 3.7  CL 105  CO2 26  BUN 18  CREATININE 0.84  CALCIUM 9.2  GLUCOSE 69   Lab Results  Component Value Date   CHOL 208 (H) 07/12/2014   HDL 76 07/12/2014   LDLCALC 115 (H) 07/12/2014   TRIG 86 07/12/2014     Radiology Studies    Dg Chest 2 View  Result Date: 04/22/2016 CLINICAL DATA:  Chest pain and tightness EXAM: CHEST  2 VIEW COMPARISON:  07/08/2012 FINDINGS: Cardiac shadow is within normal limits. The lungs are hyperinflated bilaterally. No focal infiltrate or sizable effusion is seen. Postsurgical changes are again noted. Patchy scarring is noted in the right upper lobe stable from the previous exam. No acute bony abnormality is noted. IMPRESSION: COPD without acute abnormality. Electronically Signed   By: Inez Catalina M.D.   On: 04/22/2016 14:40    ECG & Cardiac Imaging    RSR, 85, baseline artifact, prior septal infarct, mild inflat st dep  Assessment & Plan    1.  NSTEMI/CAD:  Pt w/ a prior h/o CAD s/p CABG with 3/3 patent grafts and patent LCX stent on cath in 06/2012, presented to the [facility] ED on 2/7 with chest pain, that  eventually resolved after 2 sl ntg.  Total duration of c/p was about 1 hr.  Though initial trop was nl, it did rise and peaked @ 0.53.  It is down-trending this am.  She has had no further chest pain.  We discussed options for work up to include diagnostic catheterization.  She wishes to avoid cath and/or stress testing if possible.  In that setting, we will obtain and echo to eval EF.  If echo shows nl EF w/o wma, we would plan to continue medical therapy w/ asa, plavix, statin,  blocker, and we have added long-acting nitrate therapy.  If however, if echo shows new LV dysfxn or wma, we will need to strongly consider cath.  She is agreeable with this plan.  Continue heparin for now.  2.  Hypertensive Heart Disease:  BP was elevated in ED.  ? Role of hypertensive urgency with demand ischemia in setting of known, severe, multivessel, native CAD.  BP better this am.  Cont  blocker.  Long-acting nitrate added.  3.  HL: cont high potency statin Rx.  Last LDL was have in epic is from 06/2014 and is 115.  Needs f/u.  Signed, Murray Hodgkins, NP 04/23/2016, 2:54 PM   Attending Note Patient seen and examined, agree with detailed note above,  Patient presentation and plan discussed on rounds.  Cardiology consult placed by Dr.  Vianne Bulls for elevated troponin, chest pain  EKG lab work, chest x-ray  reviewed independently by myself Echocardiogram is pending  83 year old woman with history of coronary disease, prior bypass surgery Prior catheterization April 2014 in the setting of syncope with details as above Presenting with acute onset of chest pain lasting proximally one hour, relieved with nitroglycerin. No further episodes, peak troponin 0.5 now trending down 0.3  Patient has indicated she does not want cardiac catheterization She had previous pharmacologic Myoview in the past but had severe reaction, prefers not to have a Myoview  She is willing to undergo echocardiogram  On physical exam she  is in no distress, lungs are clear, no JVD, heart sounds regular with no murmurs appreciated, abdomen soft nontender, no significant lower extremity edema  EKG shows normal sinus rhythm with rate 85 bpm, old anteroseptal infarct  --- Elevated troponin Hypertensive on arrival, unable to exclude demand ischemia in the setting of severe hypertension. She previously decreased the metoprolol on her own as she reported having low blood pressure at home. 180 on arrival systolic Unable to exclude non-ST elevation MI as she does have known coronary disease, history of bypass surgery She is declining ischemia workup at this time We'll start isosorbide mononitrate, continue aspirin, statin, beta blocker We can hold heparin infusion as troponin trending down, pain-free for 24 hours Would ambulate around the unit before discharge, consider keeping overnight as she has not ambulated yet and we are starting new medications for hypertension which is still poorly controlled  --- Hypertension As above, systolic pressure to [id number] possibly responsible for troponin elevation Start isosorbide, continue metoprolol If blood pressure continues to run high may need to increase metoprolol up to 50 mill grams twice a day  ----Hyperlipidemia Statin, goal LDL less than 70  Long discussion with patient concerning various types of testing above, various treatment options. All questions answered. Discussed with family at the bedside Greater than 50% was spent in counseling and coordination of care with patient Total encounter time 185minutes or more   Signed: Esmond Plants  M.D., Ph.D. [facility]

## 2016-04-23 NOTE — Progress Notes (Signed)
Dr Marcille Blanco made aware of troponin trends.  Pt pain free since arrival to floor.

## 2016-04-23 NOTE — Progress Notes (Signed)
*  PRELIMINARY RESULTS* Echocardiogram 2D Echocardiogram has been performed.  Sally Miller 04/23/2016, 4:04 PM

## 2016-04-23 NOTE — Care Management Obs Status (Signed)
[place] NOTIFICATION   Patient Details  Name: Sally Miller MRN: [id number] Date of Birth: [birth year]   Medicare Observation Status Notification Given:  Yes    Katrina Stack, RN 04/23/2016, 4:46 PM

## 2016-04-23 NOTE — Progress Notes (Signed)
Patient ID: Sally Miller, female   DOB: 06/22/1930, 83 y.o.   MRN: [id number]  [employer] PROGRESS NOTE  Sally Miller [id number] DOB: 04-Oct-1930 DOA: 04/22/2016 PCP: Sally Fennel, MD  HPI/Subjective: Patient currently feels well. Offers no complaints. Stated she was watching TV and developed some chest pain and shortness of breath. It lasted a couple hours. She took a nitroglycerin without relief but her nitroglycerin was a couple years old.  Objective: Vitals:   04/23/16 0740 04/23/16 1319  BP: (!) 163/61 108/64  Pulse: 84 81  Resp: 17 16  Temp: 97.9 F (36.6 C) 97.9 F (36.6 C)    Filed Weights   04/22/16 1358 04/23/16 0357  Weight: 39.9 kg (88 lb) 38.5 kg (84 lb 14 oz)    ROS: Review of Systems  Constitutional: Negative for chills and fever.  Eyes: Negative for blurred vision.  Respiratory: Negative for cough and shortness of breath.   Cardiovascular: Negative for chest pain.  Gastrointestinal: Negative for abdominal pain, constipation, diarrhea, nausea and vomiting.  Genitourinary: Negative for dysuria.  Musculoskeletal: Negative for joint pain.  Neurological: Negative for dizziness and headaches.   Exam: Physical Exam  HENT:  Nose: No mucosal edema.  Mouth/Throat: No oropharyngeal exudate or posterior oropharyngeal edema.  Eyes: Conjunctivae, EOM and lids are normal. Pupils are equal, round, and reactive to light.  Neck: No JVD present. Carotid bruit is not present. No edema present. No thyroid mass and no thyromegaly present.  Cardiovascular: S1 normal and S2 normal.  Exam reveals no gallop.   No murmur heard. Pulses:      Dorsalis pedis pulses are 2+ on the right side, and 2+ on the left side.  Respiratory: No respiratory distress. She has no wheezes. She has no rhonchi. She has no rales.  GI: Soft. Bowel sounds are normal. There is no tenderness.  Musculoskeletal:       Right ankle: She exhibits no swelling.       Left ankle: She exhibits no  swelling.  Lymphadenopathy:    She has no cervical adenopathy.  Neurological: She is alert. No cranial nerve deficit.  Skin: Skin is warm. No rash noted. Nails show no clubbing.  Psychiatric: She has a normal mood and affect.      Data Reviewed: Basic Metabolic Panel:  Recent Labs Lab 04/22/16 1403 04/23/16 0307  NA 138 137  K 3.5 3.7  CL 101 105  CO2 30 26  GLUCOSE 148* 69  BUN 24* 18  CREATININE 0.98 0.84  CALCIUM 9.5 9.2   CBC:  Recent Labs Lab 04/22/16 1403 04/23/16 0307  WBC 6.4 6.2  HGB 13.1 12.4  HCT 38.1 36.5  MCV 92.7 92.7  PLT 164 157   Cardiac Enzymes:  Recent Labs Lab 04/22/16 1403 04/22/16 1749 04/22/16 2057 04/23/16 0307  TROPONINI <0.03 0.36* 0.53* 0.35*    CBG:  Recent Labs Lab 04/23/16 0950  GLUCAP 104*     Studies: Dg Chest 2 View  Result Date: 04/22/2016 CLINICAL DATA:  Chest pain and tightness EXAM: CHEST  2 VIEW COMPARISON:  07/08/2012 FINDINGS: Cardiac shadow is within normal limits. The lungs are hyperinflated bilaterally. No focal infiltrate or sizable effusion is seen. Postsurgical changes are again noted. Patchy scarring is noted in the right upper lobe stable from the previous exam. No acute bony abnormality is noted. IMPRESSION: COPD without acute abnormality. Electronically Signed   By: Sally Miller M.D.   On: 04/22/2016 14:40    Scheduled Meds: . aspirin  EC  81 mg Oral Daily  . clopidogrel  75 mg Oral Daily  . docusate sodium  100 mg Oral BID  . isosorbide mononitrate  30 mg Oral Daily  . metoprolol tartrate  25 mg Oral BID  . multivitamin with minerals  1 tablet Oral Daily  . rosuvastatin  40 mg Oral Daily  . sodium chloride flush  3 mL Intravenous Q12H    Assessment/Plan:  1. NSTEMI. Chest pain and shortness of breath with borderline troponin.  Patient has a history of coronary artery disease. Patient was on heparin drip. Dr. Rockey Miller discontinued the heparin drip this afternoon. Patient's is on aspirin, Plavix,  Crestor and imdur. Patient had prior allergy with stress testing. Patient did not want to do a cardiac cathetirization today. Echocardiogram ordered. Try to ambulate to see if she has further symptoms. Potential discharge tomorrow if no further symptoms. 2. Hyperlipidemia unspecified on Crestor 3. Essential hypertension on Toprol  Code Status:     Code Status Orders        Start     Ordered   04/22/16 1642  Full code  Continuous     04/22/16 1642    Code Status History    Date Active Date Inactive Code Status Order ID Comments User Context   This patient has a current code status but no historical code status.     Family Communication: family at the bedside Disposition Plan: likely home tomorrow if no further symptoms  Consultants:  cardiology  Time spent: 28 minutes  Sally Miller, [place]

## 2016-04-23 NOTE — Plan of Care (Signed)
Problem: Activity: Goal: Ability to tolerate increased activity will improve Outcome: Progressing Pain free since arrival to unit.  Ambulates in room to bathroom without distress.

## 2016-04-24 DIAGNOSIS — I214 Non-ST elevation (NSTEMI) myocardial infarction: Secondary | ICD-10-CM | POA: Diagnosis not present

## 2016-04-24 LAB — GLUCOSE, CAPILLARY: GLUCOSE-CAPILLARY: 92 mg/dL (ref 65–99)

## 2016-04-24 MED ORDER — NITROGLYCERIN 0.4 MG SL SUBL: 0.4000 mg | SUBLINGUAL_TABLET | SUBLINGUAL | 3 refills | Status: AC | PRN

## 2016-04-24 MED ORDER — ENSURE ENLIVE PO LIQD
237.0000 mL | Freq: Two times a day (BID) | ORAL | Status: DC
  Administered 2016-04-24: 237 mL via ORAL

## 2016-04-24 MED ORDER — ISOSORBIDE MONONITRATE ER 30 MG PO TB24: 30.0000 mg | ORAL_TABLET | Freq: Every day | ORAL | 2 refills | Status: DC

## 2016-04-24 MED ORDER — CLOPIDOGREL BISULFATE 75 MG PO TABS: 75.0000 mg | ORAL_TABLET | Freq: Every day | ORAL | 2 refills | Status: DC

## 2016-04-24 MED ORDER — ENSURE ENLIVE PO LIQD
237.0000 mL | Freq: Two times a day (BID) | ORAL | 12 refills | Status: AC
Start: 2016-04-24 — End: ?

## 2016-04-24 MED ORDER — METOPROLOL TARTRATE 25 MG PO TABS: 25.0000 mg | ORAL_TABLET | Freq: Two times a day (BID) | ORAL | 2 refills | Status: DC

## 2016-04-24 NOTE — Progress Notes (Signed)
Patient is alert and oriented, vss, no complaints of pain.  D/C telemetry and PIV.  Able to repeat back information about new medication and changes in medications.  No questions at this time.  Daughter at the bedside.  Patient to be escorted out of hospital via wheelchair by volunteers.

## 2016-04-24 NOTE — Evaluation (Signed)
Physical Therapy Evaluation Patient Details Name: Sally Miller MRN: [id number] DOB: 1931/03/11 Today's Date: 04/24/2016   History of Present Illness  Sally Miller  is a 83 y.o. female with a known history of Coronary artery disease status post CABG comes in because of chest pain. Noticed chest pain this afternoon midsternal region associated with shortness of breath, sweating. Did not hear any nausea or vomiting. Patient had diaphoresis. She took aspirin, nitrates with relief of symptoms. Physician spoke to [facility] cardiology on-call who recommended admission because of her history of CAD. Pt currently asymptomatic at time of PT evaluation.   Clinical Impression  Pt demonstrates excellent mobility with physical therapist. She is independent for bed mobility, transfers, and ambulation without an assistive device. She is able to perform 2 laps around RN station and stair training. She is able to ascend/descend 6 steps safely and with alternating stepping pattern. HR remains mostly in the 80's and does not exceed 90 bpm. She reports very mild DOE rated as 2/10 on Modified BORG Dyspnea scale. SaO2 remains at or above 97% on room air. Pt denies chest pain throughout ambulation distance. She is able to perform horizontal and vertical head turns as well as gait speed changes without gait deviation. Negative Rhomberg and single leg balance is at least 10 seconds. No PT needs identified at this time. RN notified of vitals with activity. PT order will be completed. Please enter new order if status or needs change.    Follow Up Recommendations No PT follow up    Equipment Recommendations  None recommended by PT    Recommendations for Other Services       Precautions / Restrictions Precautions Precautions: Fall Restrictions Weight Bearing Restrictions: No      Mobility  Bed Mobility Overal bed mobility: Independent             General bed mobility comments: Good  speed/sequencing  Transfers Overall transfer level: Independent Equipment used: None             General transfer comment: Good speed, sequencing and stability. No impairments noted  Ambulation/Gait Ambulation/Gait assistance: Independent Ambulation Distance (Feet): 500 Feet Assistive device: None Gait Pattern/deviations: WFL(Within Functional Limits) Gait velocity: WFL for full community mobil Gait velocity interpretation: >2.62 ft/sec, indicative of independent community ambulator General Gait Details: Pt able to ambulate 2 laps around RN station as well as perform stair training. No deviations in gait noted. Able to perform horizontal and vertical head turns as well as gait speed changes without lateral deviation noted. Pt reports mild DOE, 2/10 on BORG. HR remains around 90 bpm and pt denies chest pain. SaO2 at or above 97% throughout ambulation distance  Stairs Stairs: Yes Stairs assistance: Independent Stair Management: One rail Right;Alternating pattern Number of Stairs: 6 General stair comments: Pt able to ascend/descend 6 stairs with step-over step pattern and single rail. No safety concerns noted with stairs  Wheelchair Mobility    Modified Rankin (Stroke Patients Only)       Balance Overall balance assessment: Independent             Standing balance comment: Negative Rhomberg, single leg balance greater than 10 seconds.                             Pertinent Vitals/Pain Pain Assessment: No/denies pain    Home Living Family/patient expects to be discharged to:: Private residence Living Arrangements: Children;Other (Comment) (Daughter) Available Help at  Discharge: Family Type of Home: House Home Access: Level entry     Home Layout: Multi-level;Bed/bath upstairs Home Equipment: None      Prior Function Level of Independence: Independent         Comments: Independent with ADLs/IADLs. Ambulates full community distances without an  assistive device. No falls. Does not drive     Hand Dominance   Dominant Hand: Right    Extremity/Trunk Assessment   Upper Extremity Assessment Upper Extremity Assessment: Overall WFL for tasks assessed    Lower Extremity Assessment Lower Extremity Assessment: Overall WFL for tasks assessed       Communication   Communication: No difficulties  Cognition Arousal/Alertness: Awake/alert Behavior During Therapy: WFL for tasks assessed/performed Overall Cognitive Status: Within Functional Limits for tasks assessed                      General Comments      Exercises     Assessment/Plan    PT Assessment Patent does not need any further PT services  PT Problem List            PT Treatment Interventions      PT Goals (Current goals can be found in the Care Plan section)  Acute Rehab PT Goals PT Goal Formulation: All assessment and education complete, DC therapy    Frequency     Barriers to discharge        Co-evaluation               End of Session Equipment Utilized During Treatment: Gait belt Activity Tolerance: Patient tolerated treatment well Patient left: in bed;with call bell/phone within reach;with bed alarm set Nurse Communication: Mobility status;Other (comment) (Vitals during ambulation)    Functional Assessment Tool Used: clinical judgement Functional Limitation: Mobility: Walking and moving around Mobility: Walking and Moving Around Current Status [id number]): 0 percent impaired, limited or restricted Mobility: Walking and Moving Around Goal Status [phone]): 0 percent impaired, limited or restricted Mobility: Walking and Moving Around Discharge Status [phone]): 0 percent impaired, limited or restricted    Time: [id number] PT Time Calculation (min) (ACUTE ONLY): 13 min   Charges:   PT Evaluation $PT Eval Low Complexity: 1 Procedure     PT G Codes:   PT G-Codes **NOT FOR INPATIENT CLASS** Functional Assessment Tool Used: clinical  judgement Functional Limitation: Mobility: Walking and moving around Mobility: Walking and Moving Around Current Status [id number]): 0 percent impaired, limited or restricted Mobility: Walking and Moving Around Goal Status [id number]): 0 percent impaired, limited or restricted Mobility: Walking and Moving Around Discharge Status [id number]): 0 percent impaired, limited or restricted   Phillips Grout PT, DPT   Huprich,Jason 04/24/2016, 9:46 AM

## 2016-04-24 NOTE — Discharge Summary (Signed)
[facility] [place] NAME: Sally Miller    MR#:  [id number]  DATE OF BIRTH:  May 16, 1930  DATE OF ADMISSION:  04/22/2016   ADMITTING PHYSICIAN: Epifanio Lesches, MD  DATE OF DISCHARGE: 04/24/2016 11:39 AM  PRIMARY CARE PHYSICIAN: BABAOFF, MARC E, MD   ADMISSION DIAGNOSIS:   Chest pain, unspecified type [R07.9]  DISCHARGE DIAGNOSIS:   Active Problems:   Chest pain   Hx of CABG   Elevated troponin   SECONDARY DIAGNOSIS:   Past Medical History:  Diagnosis Date  . CAD (coronary artery disease)    a. S/P prior LAD/LCX stenting;  b. S/P prior CABG x 3; c. 06/2012 Cath: LM 20, LAD 100p, 12m ISR, LCX 20p ISR, 60m ISR, RCA 100 CTO, patent LIMA to LAD, SVG to diag2, SVG to OM2.   Marland Kitchen HTN (hypertension)   . Hyperlipemia   . MI (myocardial infarction)   . Syncope    a. 06/2012.    HOSPITAL COURSE:   83 year old female with past medical history significant for CAD status post CABG, hypertension, hyperlipidemia results from home secondary to chest pain and noted to have elevated troponin.  #1 NSTEMI- worsened by hypertensive urgency on presentation - patient refused cardiac catheterization - received IV heparin. Chest pain resolved - stress test couldn't be done due to severe allergy in past - continue medical mgmt with asa, plavix, metoprolol, statin. Imdur has been added prior to discharge -Appreciate cardiology consult. Outpatient follow-up recommended.  #2 hypertension-continue metoprolol, Imdur has been added. Centigrade with hypertensive urgency. Metoprolol dose also has been increased -Close outpatient follow-up recommended  #3 hyperlipidemia-on statin  Patient has been stable. Ambulated well without any changes on telemetry, no chest pain. Being discharged today  DISCHARGE CONDITIONS:   Stable  CONSULTS OBTAINED:   Treatment Team:  Thayer Headings, MD Minna Merritts, MD  DRUG ALLERGIES:   Allergies  Allergen  Reactions  . Other     Dye used for chemical stress test.    DISCHARGE MEDICATIONS:   Allergies as of 04/24/2016      Reactions   Other    Dye used for chemical stress test.       Medication List    TAKE these medications   aspirin 81 MG tablet Take 1 tablet (81 mg total) by mouth daily.   clopidogrel 75 MG tablet Commonly known as:  PLAVIX Take 1 tablet (75 mg total) by mouth daily.   feeding supplement (ENSURE ENLIVE) Liqd Take 237 mLs by mouth 2 (two) times daily between meals.   fluticasone 50 MCG/ACT nasal spray Commonly known as:  FLONASE Place 1 spray into both nostrils daily.   isosorbide mononitrate 30 MG 24 hr tablet Commonly known as:  IMDUR Take 1 tablet (30 mg total) by mouth daily.   metoprolol tartrate 25 MG tablet Commonly known as:  LOPRESSOR Take 1 tablet (25 mg total) by mouth 2 (two) times daily. What changed:  when to take this   multivitamin capsule Take 1 capsule by mouth daily.   nitroGLYCERIN 0.4 MG SL tablet Commonly known as:  NITROSTAT Place 1 tablet (0.4 mg total) under the tongue every 5 (five) minutes as needed.   Olopatadine HCl 0.2 % Soln Place 1 drop into both eyes daily.   rosuvastatin 40 MG tablet Commonly known as:  CRESTOR Take 40 mg by mouth daily.        DISCHARGE INSTRUCTIONS:   1. PCP f/u in  1-2 weeks 2. Cardiology f/u in 2 weeks  DIET:   Cardiac diet  ACTIVITY:   Activity as tolerated  OXYGEN:   Home Oxygen: No.  Oxygen Delivery: room air  DISCHARGE LOCATION:   home   If you experience worsening of your admission symptoms, develop shortness of breath, life threatening emergency, suicidal or homicidal thoughts you must seek medical attention immediately by calling 911 or calling your MD immediately  if symptoms less severe.  You Must read complete instructions/literature along with all the possible adverse reactions/side effects for all the Medicines you take and that have been prescribed to you.  Take any new Medicines after you have completely understood and accpet all the possible adverse reactions/side effects.   Please note  You were cared for by a hospitalist during your hospital stay. If you have any questions about your discharge medications or the care you received while you were in the hospital after you are discharged, you can call the unit and asked to speak with the hospitalist on call if the hospitalist that took care of you is not available. Once you are discharged, your primary care physician will handle any further medical issues. Please note that NO REFILLS for any discharge medications will be authorized once you are discharged, as it is imperative that you return to your primary care physician (or establish a relationship with a primary care physician if you do not have one) for your aftercare needs so that they can reassess your need for medications and monitor your lab values.    On the day of Discharge:  VITAL SIGNS:   Blood pressure 121/61, pulse 69, temperature 97.3 F (36.3 C), temperature source Oral, resp. rate 16, height 5\' 1"  (1.549 m), weight 38.3 kg (84 lb 8 oz), SpO2 95 %.  PHYSICAL EXAMINATION:    GENERAL:  83 y.o.-year-old thin built patient sitting in the bed with no acute distress.  EYES: Pupils equal, round, reactive to light and accommodation. No scleral icterus. Extraocular muscles intact.  HEENT: Head atraumatic, normocephalic. Oropharynx and nasopharynx clear.  NECK:  Supple, no jugular venous distention. No thyroid enlargement, no tenderness.  LUNGS: Normal breath sounds bilaterally, no wheezing, rales,rhonchi or crepitation. No use of accessory muscles of respiration.  CARDIOVASCULAR: S1, S2 normal. No murmurs, rubs, or gallops.  ABDOMEN: Soft, non-tender, non-distended. Bowel sounds present. No organomegaly or mass.  EXTREMITIES: No pedal edema, cyanosis, or clubbing.  NEUROLOGIC: Cranial nerves II through XII are intact. Muscle strength  5/5 in all extremities. Sensation intact. Gait not checked.  PSYCHIATRIC: The patient is alert and oriented x 3.  SKIN: No obvious rash, lesion, or ulcer.   DATA REVIEW:   CBC  Recent Labs Lab 04/23/16 0307  WBC 6.2  HGB 12.4  HCT 36.5  PLT 157    Chemistries   Recent Labs Lab 04/23/16 0307  NA 137  K 3.7  CL 105  CO2 26  GLUCOSE 69  BUN 18  CREATININE 0.84  CALCIUM 9.2     Microbiology Results  No results found for this or any previous visit.  RADIOLOGY:  No results found.   Management plans discussed with the patient, family and they are in agreement.  CODE STATUS:     Code Status Orders        Start     Ordered   04/22/16 1642  Full code  Continuous     04/22/16 1642    Code Status History    Date Active Date  Inactive Code Status Order ID Comments User Context   This patient has a current code status but no historical code status.      TOTAL TIME TAKING CARE OF THIS PATIENT: 37 minutes.    Argus Caraher M.D on 04/24/2016 at 2:12 PM  Between 7am to 6pm - Pager - [phone]  After 6pm go to [email or web address] - Proofreader  [facility]  Office  [phone]  CC: Primary care physician; BABAOFF, Caryl Bis, MD   Note: This dictation was prepared with Dragon dictation along with smaller phrase technology. Any transcriptional errors that result from this process are unintentional.

## 2016-05-07 ENCOUNTER — Encounter: Payer: Self-pay | Admitting: Physician Assistant

## 2016-05-07 ENCOUNTER — Ambulatory Visit (INDEPENDENT_AMBULATORY_CARE_PROVIDER_SITE_OTHER): Payer: Medicare Other | Admitting: Physician Assistant

## 2016-05-07 VITALS — BP 140/78 | HR 75 | Ht 61.0 in | Wt 89.8 lb

## 2016-05-07 DIAGNOSIS — E785 Hyperlipidemia, unspecified: Secondary | ICD-10-CM

## 2016-05-07 DIAGNOSIS — I209 Angina pectoris, unspecified: Secondary | ICD-10-CM

## 2016-05-07 DIAGNOSIS — I25708 Atherosclerosis of coronary artery bypass graft(s), unspecified, with other forms of angina pectoris: Secondary | ICD-10-CM

## 2016-05-07 DIAGNOSIS — I25810 Atherosclerosis of coronary artery bypass graft(s) without angina pectoris: Secondary | ICD-10-CM

## 2016-05-07 DIAGNOSIS — I10 Essential (primary) hypertension: Secondary | ICD-10-CM | POA: Diagnosis not present

## 2016-05-07 NOTE — Patient Instructions (Addendum)
Medication Instructions:  Your physician recommends that you continue on your current medications as directed. Please refer to the Current Medication list given to you today.   Labwork: None ordered  Testing/Procedures: None ordered  Follow-Up: Your physician recommends that you schedule a follow-up appointment in: 3 MONTHS   Any Other Special Instructions Will Be Listed Below (If Applicable).     If you need a refill on your cardiac medications before your next appointment, please call your pharmacy.

## 2016-05-07 NOTE — Progress Notes (Signed)
Cardiology Office Note    Date:  05/07/2016   ID:  Sally Miller, DOB 30-Mar-1930, MRN [id number]  PCP:  Marcello Fennel, MD  Cardiologist: Dr. Acie Fredrickson (patient was to f/u @ [place] office as she lives there)   Chief Complaint: Hospital follow up  For NSTEMI  History of Present Illness:   Sally Miller is a 83 y.o. female CAD s/p CABG, HTN, HL, and syncope who Recently admitted with non-STEMI present for follow-up.  Her last cath was performed in 06/2012 revealing 3/3 patent grafts with a CTO of the RCA, occluded prox LAD, and moderate nonobstructive LCX dzs.  She has been medically managed ever since and had done quite well.   She was admitted 04/22/16 for chest pain and found to have NSTEMI. Peak of troponin 0.53. Patient has indicated she does not want cardiac catheterization. She had previous pharmacologic Myoview in the past but had severe reaction, prefers not to have a Myoview. Echo showed normal LV function. No WM abnormality. Her metoprolol increase due to high blood pressure.   Patient is here for follow-up. No further episode of chest pain or dyspnea. She is walking around the house and going up and down the stairs without any symptoms of angina. She denies orthopnea, PND, syncope, lower extremity edema, melena or blood in her stool or drink. Her blood pressure runs in the 120-140/60 -80s at home.   Past Medical History:  Diagnosis Date  . CAD (coronary artery disease)    a. S/P prior LAD/LCX stenting;  b. S/P prior CABG x 3; c. 06/2012 Cath: LM 20, LAD 100p, 7m ISR, LCX 20p ISR, 72m ISR, RCA 100 CTO, patent LIMA to LAD, SVG to diag2, SVG to OM2.   Marland Kitchen HTN (hypertension)   . Hyperlipemia   . MI (myocardial infarction)   . Syncope    a. 06/2012.    Past Surgical History:  Procedure Laterality Date  . CATARACT EXTRACTION, BILATERAL    . CORONARY ANGIOPLASTY    . CORONARY ARTERY BYPASS GRAFT  1996  . RENAL ARTERY STENT      Current Medications: Prior to Admission  medications   Medication Sig Start Date End Date Taking? Authorizing Provider  aspirin 81 MG tablet Take 1 tablet (81 mg total) by mouth daily. 12/26/12   Thayer Headings, MD  clopidogrel (PLAVIX) 75 MG tablet Take 1 tablet (75 mg total) by mouth daily. 04/24/16   Gladstone Lighter, MD  feeding supplement, ENSURE ENLIVE, (ENSURE ENLIVE) LIQD Take 237 mLs by mouth 2 (two) times daily between meals. 04/24/16   Gladstone Lighter, MD  fluticasone (FLONASE) 50 MCG/ACT nasal spray Place 1 spray into both nostrils daily. 01/21/16   Historical Provider, MD  isosorbide mononitrate (IMDUR) 30 MG 24 hr tablet Take 1 tablet (30 mg total) by mouth daily. 04/24/16   Gladstone Lighter, MD  metoprolol tartrate (LOPRESSOR) 25 MG tablet Take 1 tablet (25 mg total) by mouth 2 (two) times daily. 04/24/16   Gladstone Lighter, MD  Multiple Vitamin (MULTIVITAMIN) capsule Take 1 capsule by mouth daily.    Historical Provider, MD  nitroGLYCERIN (NITROSTAT) 0.4 MG SL tablet Place 1 tablet (0.4 mg total) under the tongue every 5 (five) minutes as needed. 04/24/16   Gladstone Lighter, MD  Olopatadine HCl 0.2 % SOLN Place 1 drop into both eyes daily.  04/12/16   Historical Provider, MD  rosuvastatin (CRESTOR) 40 MG tablet Take 40 mg by mouth daily.    Historical Provider, MD  Allergies:   Other   Social History   Social History  . Marital status: Widowed    Spouse name: N/A  . Number of children: N/A  . Years of education: N/A   Social History Main Topics  . Smoking status: Never Smoker  . Smokeless tobacco: Never Used  . Alcohol use No  . Drug use: No  . Sexual activity: Not Asked   Other Topics Concern  . None   Social History Narrative  . None     Family History:  The patient's family history includes Coronary artery disease in her brother and sister; Heart attack in her brother and brother; Heart failure in her sister.   ROS:   Please see the history of present illness.    ROS All other systems reviewed and  are negative.   PHYSICAL EXAM:   VS:  BP 140/78   Pulse 75   Ht 5\' 1"  (1.549 m)   Wt 89 lb 12.8 oz (40.7 kg)   BMI 16.97 kg/m    GEN: Well nourished, well developed, in no acute distress  HEENT: normal  Neck: no JVD, carotid bruits, or masses Cardiac: RRR; no murmurs, rubs, or gallops,no edema  Respiratory:  clear to auscultation bilaterally, normal work of breathing GI: soft, nontender, nondistended, + BS MS: no deformity or atrophy  Skin: warm and dry, no rash Neuro:  Alert and Oriented x 3, Strength and sensation are intact Psych: euthymic mood, full affect  Wt Readings from Last 3 Encounters:  05/07/16 89 lb 12.8 oz (40.7 kg)  04/24/16 84 lb 8 oz (38.3 kg)  09/18/15 89 lb 12.8 oz (40.7 kg)      Studies/Labs Reviewed:   EKG:  EKG is not ordered today.   Recent Labs: 04/23/2016: BUN 18; Creatinine, Ser 0.84; Hemoglobin 12.4; Platelets 157; Potassium 3.7; Sodium 137   Lipid Panel    Component Value Date/Time   CHOL 208 (H) 07/12/2014 1152   TRIG 86 07/12/2014 1152   HDL 76 07/12/2014 1152   CHOLHDL 3 06/23/2010 1104   VLDL 17 07/12/2014 1152   LDLCALC 115 (H) 07/12/2014 1152   LDLDIRECT 120.5 06/23/2010 1104    Additional studies/ records that were reviewed today include:   Echocardiogram: 04/23/16 Study Conclusions  - Left ventricle: The cavity size was normal. Systolic function was   normal. The estimated ejection fraction was in the range of 60%   to 65%. Wall motion was normal; there were no regional wall   motion abnormalities. Doppler parameters are consistent with   abnormal left ventricular relaxation (grade 1 diastolic   dysfunction). - Aortic valve: Trileaflet; moderately calcified leaflets. Valve   area (VTI): 1.84 cm^2. - Mitral valve: There was mild regurgitation. - Left atrium: The atrium was normal in size. - Right ventricle: Systolic function was normal. - Pulmonary arteries: Systolic pressure was within the normal    range.    ASSESSMENT & PLAN:    1. CAD s/p CABG - Recent NSTEMI with peak of troponin to 0.53. EF was normal on echo. Patient wishes to avoid cath and/or stress testing if possible. Patient discharged without intervention. Her metoprolol increase to 25mg  BID due to high blood pressure. No further chest pain.  - Continue ASA, Plavix and statin.   2.. HTN - BP was elevated during admission leading to increasing beta blocker dose. Her blood pressure running in 120-140/60-80s at home. BP her 140/78. Repeat 136/68. Continue current regimen.   3. HLD. - Continue statin.  Medication Adjustments/Labs and Tests Ordered: Current medicines are reviewed at length with the patient today.  Concerns regarding medicines are outlined above.  Medication changes, Labs and Tests ordered today are listed in the Patient Instructions below. Patient Instructions  Medication Instructions:  Your physician recommends that you continue on your current medications as directed. Please refer to the Current Medication list given to you today.   Labwork: None ordered  Testing/Procedures: None ordered  Follow-Up: Your physician recommends that you schedule a follow-up appointment in: 3 MONTHS   Any Other Special Instructions Will Be Listed Below (If Applicable).     If you need a refill on your cardiac medications before your next appointment, please call your pharmacy.      Jarrett Soho, [place]  05/07/2016 1:51 PM    [facility] [address], [place], [place]  [address] Phone: [phone]; Fax: [phone]

## 2016-05-11 ENCOUNTER — Telehealth: Payer: Self-pay

## 2016-05-11 NOTE — Telephone Encounter (Signed)
L MOM to schedule TCM. Pt saw Dr. Rockey Situ

## 2016-05-11 NOTE — Telephone Encounter (Signed)
-----   Message from Minna Merritts, MD sent at 05/10/2016 12:12 PM EST ----- Can we set up follow up in clinic I saw her in the hospital thx TG  ----- Message ----- From: Leanor Kail, PA Sent: 05/07/2016   1:57 PM To: Minna Merritts, MD, Thayer Headings, MD  This is a patient of Dr. Acie Fredrickson. Patient is recently admitted 04/2016 at [facility] for NSTEMI. Seen by Dr. Felipa Emory.  Asymptomatic during my visit. Patient lives in [place] and wishes to follow up there.  Thanks  [employer]

## 2016-05-12 NOTE — Telephone Encounter (Signed)
L MOM for daughter to call and schedule hosp f/u . Seen by Dr. Rockey Situ .

## 2016-08-28 ENCOUNTER — Institutional Professional Consult (permissible substitution): Payer: Medicare Other | Admitting: Cardiovascular Disease

## 2016-10-25 NOTE — Progress Notes (Signed)
Cardiology Office Note  Date:  10/26/2016   ID:  Sally Miller, DOB Mar 13, 1931, MRN [id number]  PCP:  Derinda Late, MD   Chief Complaint  Patient presents with  . other    Follow up from [facility] ER; chest pain. Former Dr. Acie Fredrickson patient. Meds reviewed by the pt. verbally. "doing well."     HPI:  83 yo woman with PMH of Coronary artery disease- s/p CABG 1996 native right coronary artery was chronically occluded, collaterals.  patent LIMA to LAD, SVG to d, SVG to OM.  Hypertension Hyperlipidemia Syncope (dehydrated?) Chronic SOB  She was seen in the emergency room, kept in the hospital February 2018 for chest pain Hospital records reviewed with the patient in detail Elevated troponin secondary to poor control blood pressure She declined cardiac catheterization Stress test could not be done secondary to previous "allergy" Imdur was added For angina and blood pressure  She reports no further chest pain since previous hospital admission Active, does all of her ADLs Lab work reviewed with her, total cholesterol 210 She reports that she takes Crestor 40 daily Genetic predisposition for elevated cholesterol, daughter has total cholesterol 300  Shows normal sinus rhythm with rate 73 bpm PVCsEKG personally reviewed by myself on todays visit   PMH:   has a past medical history of CAD (coronary artery disease); HTN (hypertension); Hyperlipemia; MI (myocardial infarction) ([place]); and Syncope.  PSH:    Past Surgical History:  Procedure Laterality Date  . CATARACT EXTRACTION, BILATERAL    . CORONARY ANGIOPLASTY    . CORONARY ARTERY BYPASS GRAFT  1996  . RENAL ARTERY STENT      Current Outpatient Prescriptions  Medication Sig Dispense Refill  . aspirin 81 MG tablet Take 1 tablet (81 mg total) by mouth daily. 30 tablet 6  . clopidogrel (PLAVIX) 75 MG tablet Take 1 tablet (75 mg total) by mouth daily. 30 tablet 2  . Cyanocobalamin (VITAMIN B 12) 100 MCG LOZG Take by mouth daily.     . feeding supplement, ENSURE ENLIVE, (ENSURE ENLIVE) LIQD Take 237 mLs by mouth 2 (two) times daily between meals. 237 mL 12  . fluticasone (FLONASE) 50 MCG/ACT nasal spray Place 1 spray into both nostrils daily.    . isosorbide mononitrate (IMDUR) 30 MG 24 hr tablet Take 1 tablet (30 mg total) by mouth daily. 30 tablet 2  . metoprolol tartrate (LOPRESSOR) 25 MG tablet Take 1 tablet (25 mg total) by mouth 2 (two) times daily. 60 tablet 2  . Multiple Vitamins-Minerals (MULTI ADULT GUMMIES PO) Take 1 tablet by mouth daily.    . Multiple Vitamins-Minerals (PRESERVISION AREDS PO) Take 1 capsule by mouth 2 (two) times daily.    . nitroGLYCERIN (NITROSTAT) 0.4 MG SL tablet Place 1 tablet (0.4 mg total) under the tongue every 5 (five) minutes as needed. 30 tablet 3  . Olopatadine HCl 0.2 % SOLN Place 1 drop into both eyes daily.     . rosuvastatin (CRESTOR) 40 MG tablet Take 40 mg by mouth daily.     No current facility-administered medications for this visit.      Allergies:   Other   Social History:  The patient  reports that she has never smoked. She has never used smokeless tobacco. She reports that she does not drink alcohol or use drugs.   Family History:   family history includes Coronary artery disease in her brother and sister; Heart attack in her brother and brother; Heart failure in her sister.  Review of Systems: Review of Systems  Constitutional: Negative.   Respiratory: Negative.   Cardiovascular: Negative.   Gastrointestinal: Negative.   Musculoskeletal: Negative.   Neurological: Negative.   Psychiatric/Behavioral: Negative.   All other systems reviewed and are negative.    PHYSICAL EXAM: VS:  BP 140/70 (BP Location: Left Arm, Patient Position: Sitting, Cuff Size: Normal)   Pulse 71   Ht 5\' 1"  (7.782 m)   Wt 90 lb (40.8 kg)   BMI 17.01 kg/m  , BMI Body mass index is 17.01 kg/m. GEN: Well nourished, well developed, in no acute distress  HEENT: normal  Neck: no  JVD, carotid bruits, or masses Cardiac: RRR; no murmurs, rubs, or gallops,no edema  Respiratory:  clear to auscultation bilaterally, normal work of breathing GI: soft, nontender, nondistended, + BS MS: no deformity or atrophy  Skin: warm and dry, no rash Neuro:  Strength and sensation are intact Psych: euthymic mood, full affect    Recent Labs: 04/23/2016: BUN 18; Creatinine, Ser 0.84; Hemoglobin 12.4; Platelets 157; Potassium 3.7; Sodium 137    Lipid Panel Lab Results  Component Value Date   CHOL 208 (H) 07/12/2014   HDL 76 07/12/2014   LDLCALC 115 (H) 07/12/2014   TRIG 86 07/12/2014      Wt Readings from Last 3 Encounters:  10/26/16 90 lb (40.8 kg)  05/07/16 89 lb 12.8 oz (40.7 kg)  04/24/16 84 lb 8 oz (38.3 kg)       ASSESSMENT AND PLAN:  Coronary artery disease of native artery of native heart with stable angina pectoris (HCC) Stable angina, denies any chest pain with exertion around her house No regular exercise program  Essential hypertension Blood pressure is well controlled on today's visit. No changes made to the medications. She does have renal artery stenosis  Mixed hyperlipidemia Likely familial hyperlipidemia Long discussion with her today concerning various treatment options To start with we have recommended she start Zetia 10 mg daily She prefers to have repeat lab work done through primary care in 3 months time We did discuss new agent such as repatha and praluent  Hx of CABG Currently with no symptoms of angina. No further workup at this time. Continue current medication regimen.  PAD Previous stenosis to the right renal artery on previous catheterization 2014 also stent to the iliac vessel/artery   she is declining repeat ultrasound at this time Denies any claudication type symptoms Renal function normal, blood pressure stable   Total encounter time more than 25 minutes  Greater than 50% was spent in counseling and coordination of care with  the patient   Disposition:   F/U  6 months  No orders of the defined types were placed in this encounter.    Signed, Esmond Plants, M.D., Ph.D. 10/26/2016  [place], [place]

## 2016-10-26 ENCOUNTER — Encounter: Payer: Self-pay | Admitting: Cardiovascular Disease

## 2016-10-26 ENCOUNTER — Ambulatory Visit (INDEPENDENT_AMBULATORY_CARE_PROVIDER_SITE_OTHER): Payer: Medicare Other | Admitting: Cardiovascular Disease

## 2016-10-26 VITALS — BP 140/70 | HR 71 | Ht 61.0 in | Wt 90.0 lb

## 2016-10-26 DIAGNOSIS — Z951 Presence of aortocoronary bypass graft: Secondary | ICD-10-CM | POA: Diagnosis not present

## 2016-10-26 DIAGNOSIS — E782 Mixed hyperlipidemia: Secondary | ICD-10-CM | POA: Diagnosis not present

## 2016-10-26 DIAGNOSIS — I10 Essential (primary) hypertension: Secondary | ICD-10-CM | POA: Diagnosis not present

## 2016-10-26 DIAGNOSIS — I25810 Atherosclerosis of coronary artery bypass graft(s) without angina pectoris: Secondary | ICD-10-CM

## 2016-10-26 DIAGNOSIS — I25118 Atherosclerotic heart disease of native coronary artery with other forms of angina pectoris: Secondary | ICD-10-CM

## 2016-10-26 DIAGNOSIS — I209 Angina pectoris, unspecified: Secondary | ICD-10-CM

## 2016-10-26 MED ORDER — EZETIMIBE 10 MG PO TABS: 10.0000 mg | ORAL_TABLET | Freq: Every day | ORAL | 3 refills | Status: DC

## 2016-10-26 NOTE — Patient Instructions (Addendum)
Medication Instructions:   Please add the zetia one a day  Labwork:  No new labs needed  Testing/Procedures:  No further testing at this time   Follow-Up: It was a pleasure seeing you in the office today. Please call us if you have new issues that need to be addressed before your next appt.  [phone]  Your physician wants you to follow-up in: 6 months.  You will receive a reminder letter in the mail two months in advance. If you don't receive a letter, please call our office to schedule the follow-up appointment.  If you need a refill on your cardiac medications before your next appointment, please call your pharmacy.

## 2017-04-30 ENCOUNTER — Ambulatory Visit: Payer: Medicare Other | Admitting: Cardiovascular Disease

## 2017-05-05 NOTE — Progress Notes (Signed)
Cardiology Office Note  Date:  05/07/2017   ID:  Sally Miller, DOB Mar 17, 1930, MRN [id number]  PCP:  Sally Late, MD   Chief Complaint  Patient presents with  . Other    6 month follow up. Patient c/o SOB. Meds reviewed verbally with patient.     HPI:  84 yo woman with PMH of Coronary artery disease- s/p CABG 1996 native right coronary artery was chronically occluded, collaterals.  patent LIMA to LAD, SVG to d, SVG to OM.  Hypertension Hyperlipidemia Syncope (dehydrated?) Chronic SOB Who presents for routine follow-up of her coronary artery disease  Lab work reviewed Total cholesterol 179, LDL 102 Tolerating Crestor and Zetia  In follow-up today she is walking 1 mile on the treadmill every other day with no chest pain Feels that her shortness of breath is improving Tolerating her medications without symptoms No recent falls  Reports blood pressures well controlled  Has not had the flu shot yet Reports she is losing muscle mass  EKG personally reviewed by myself on todays visit Shows normal sinus rhythm rate 75 bpm no significant ST or T wave changes  Other past medical history reviewed emergency room, kept in the hospital February 2018 for chest pain Elevated troponin secondary to poor control blood pressure She declined cardiac catheterization Stress test could not be done secondary to previous "allergy" Imdur was added For angina and blood pressure   PMH:   has a past medical history of CAD (coronary artery disease), HTN (hypertension), Hyperlipemia, MI (myocardial infarction) ([place]), and Syncope.  PSH:    Past Surgical History:  Procedure Laterality Date  . CATARACT EXTRACTION, BILATERAL    . CORONARY ANGIOPLASTY    . CORONARY ARTERY BYPASS GRAFT  1996  . RENAL ARTERY STENT      Current Outpatient Medications  Medication Sig Dispense Refill  . aspirin 81 MG tablet Take 1 tablet (81 mg total) by mouth daily. 30 tablet 6  . clopidogrel (PLAVIX) 75 MG  tablet Take 1 tablet (75 mg total) by mouth daily. 30 tablet 2  . Cyanocobalamin (VITAMIN B 12) 100 MCG LOZG Take by mouth daily.    Marland Kitchen ezetimibe (ZETIA) 10 MG tablet Take 1 tablet (10 mg total) by mouth daily. 90 tablet 3  . feeding supplement, ENSURE ENLIVE, (ENSURE ENLIVE) LIQD Take 237 mLs by mouth 2 (two) times daily between meals. 237 mL 12  . fluticasone (FLONASE) 50 MCG/ACT nasal spray Place 1 spray into both nostrils daily.    . isosorbide mononitrate (IMDUR) 30 MG 24 hr tablet Take 1 tablet (30 mg total) by mouth daily. 30 tablet 2  . metoprolol tartrate (LOPRESSOR) 25 MG tablet Take 1 tablet (25 mg total) by mouth 2 (two) times daily. 60 tablet 2  . Multiple Vitamins-Minerals (MULTI ADULT GUMMIES PO) Take 1 tablet by mouth daily.    . Multiple Vitamins-Minerals (PRESERVISION AREDS PO) Take 1 capsule by mouth 2 (two) times daily.    . nitroGLYCERIN (NITROSTAT) 0.4 MG SL tablet Place 1 tablet (0.4 mg total) under the tongue every 5 (five) minutes as needed. 30 tablet 3  . Olopatadine HCl 0.2 % SOLN Place 1 drop into both eyes daily.     . rosuvastatin (CRESTOR) 40 MG tablet Take 40 mg by mouth daily.     No current facility-administered medications for this visit.      Allergies:   Other   Social History:  The patient  reports that  has never smoked. she has never  used smokeless tobacco. She reports that she does not drink alcohol or use drugs.   Family History:   family history includes Coronary artery disease in her brother and sister; Heart attack in her brother and brother; Heart failure in her sister.    Review of Systems: Review of Systems  Constitutional: Negative.   Respiratory: Negative.   Cardiovascular: Negative.   Gastrointestinal: Negative.   Musculoskeletal: Negative.   Neurological: Negative.   Psychiatric/Behavioral: Negative.   All other systems reviewed and are negative.    PHYSICAL EXAM: VS:  BP 128/66 (BP Location: Left Arm, Patient Position: Sitting,  Cuff Size: Normal)   Pulse 75   Ht 5\' 1"  (1.549 m)   Wt 87 lb 12 oz (39.8 kg)   BMI 16.58 kg/m  , BMI Body mass index is 16.58 kg/m. Constitutional:  oriented to person, place, and time. No distress.  HENT:  Head: Normocephalic and atraumatic.  Eyes:  no discharge. No scleral icterus.  Neck: Normal range of motion. Neck supple. No JVD present.  Cardiovascular: Normal rate, regular rhythm, normal heart sounds and intact distal pulses. Exam reveals no gallop and no friction rub. No edema No murmur heard. Pulmonary/Chest: Effort normal and breath sounds normal. No stridor. No respiratory distress.  no wheezes.  no rales.  no tenderness.  Abdominal: Soft.  no distension.  no tenderness.  Musculoskeletal: Normal range of motion.  no  tenderness or deformity.  Neurological:  normal muscle tone. Coordination normal. No atrophy Skin: Skin is warm and dry. No rash noted. not diaphoretic.  Psychiatric:  normal mood and affect. behavior is normal. Thought content normal.    Recent Labs: No results found for requested labs within last 8760 hours.    Lipid Panel Lab Results  Component Value Date   CHOL 208 (H) 07/12/2014   HDL 76 07/12/2014   LDLCALC 115 (H) 07/12/2014   TRIG 86 07/12/2014      Wt Readings from Last 3 Encounters:  05/07/17 87 lb 12 oz (39.8 kg)  10/26/16 90 lb (40.8 kg)  05/07/16 89 lb 12.8 oz (40.7 kg)       ASSESSMENT AND PLAN:  Coronary artery disease of native artery of native heart with stable angina pectoris (HCC) regular exercise program, no symptoms concerning for unstable angina We will continue her current medication regimen  Essential hypertension Blood pressure is well controlled on today's visit. No changes made to the medications. She does have renal artery stenosis stable Stable  Mixed hyperlipidemia LDL of 100 , best numbers she has had in the recent past few years continue Zetia with Crestor  Hx of CABG Currently with no symptoms of  angina. No further workup at this time. Continue current medication regimen.  Stable, regular exercise  PAD Previous stenosis to the right renal artery on previous catheterization 2014 also stent to the iliac vessel/artery  Previously declined ultrasound Denies any claudication type symptoms Using treadmill every other day 1 mile   flu shot provided today   Total encounter time more than 25 minutes  Greater than 50% was spent in counseling and coordination of care with the patient   Disposition:   F/U  12 months   Orders Placed This Encounter  Procedures  . EKG 12-Lead     Signed, Esmond Plants, M.D., Ph.D. 05/07/2017  [place], [place]

## 2017-05-07 ENCOUNTER — Encounter: Payer: Self-pay | Admitting: Cardiovascular Disease

## 2017-05-07 ENCOUNTER — Ambulatory Visit (INDEPENDENT_AMBULATORY_CARE_PROVIDER_SITE_OTHER): Payer: Medicare Other | Admitting: Cardiovascular Disease

## 2017-05-07 VITALS — BP 128/66 | HR 75 | Ht 61.0 in | Wt 87.8 lb

## 2017-05-07 DIAGNOSIS — E785 Hyperlipidemia, unspecified: Secondary | ICD-10-CM

## 2017-05-07 DIAGNOSIS — I10 Essential (primary) hypertension: Secondary | ICD-10-CM | POA: Diagnosis not present

## 2017-05-07 DIAGNOSIS — Z23 Encounter for immunization: Secondary | ICD-10-CM

## 2017-05-07 DIAGNOSIS — I208 Other forms of angina pectoris: Secondary | ICD-10-CM | POA: Diagnosis not present

## 2017-05-07 DIAGNOSIS — R0602 Shortness of breath: Secondary | ICD-10-CM | POA: Diagnosis not present

## 2017-05-07 DIAGNOSIS — I25118 Atherosclerotic heart disease of native coronary artery with other forms of angina pectoris: Secondary | ICD-10-CM

## 2017-05-07 DIAGNOSIS — Z951 Presence of aortocoronary bypass graft: Secondary | ICD-10-CM

## 2017-05-07 NOTE — Patient Instructions (Signed)

## 2017-10-30 ENCOUNTER — Other Ambulatory Visit: Payer: Self-pay | Admitting: Cardiovascular Disease

## 2018-06-19 IMAGING — CR DG CHEST 2V
2 series · 2 of 2 positions shown · non-contrast
Comparison: 07/08/2012

CLINICAL DATA: Chest pain and tightness

EXAM:
CHEST  2 VIEW

[chest pa]
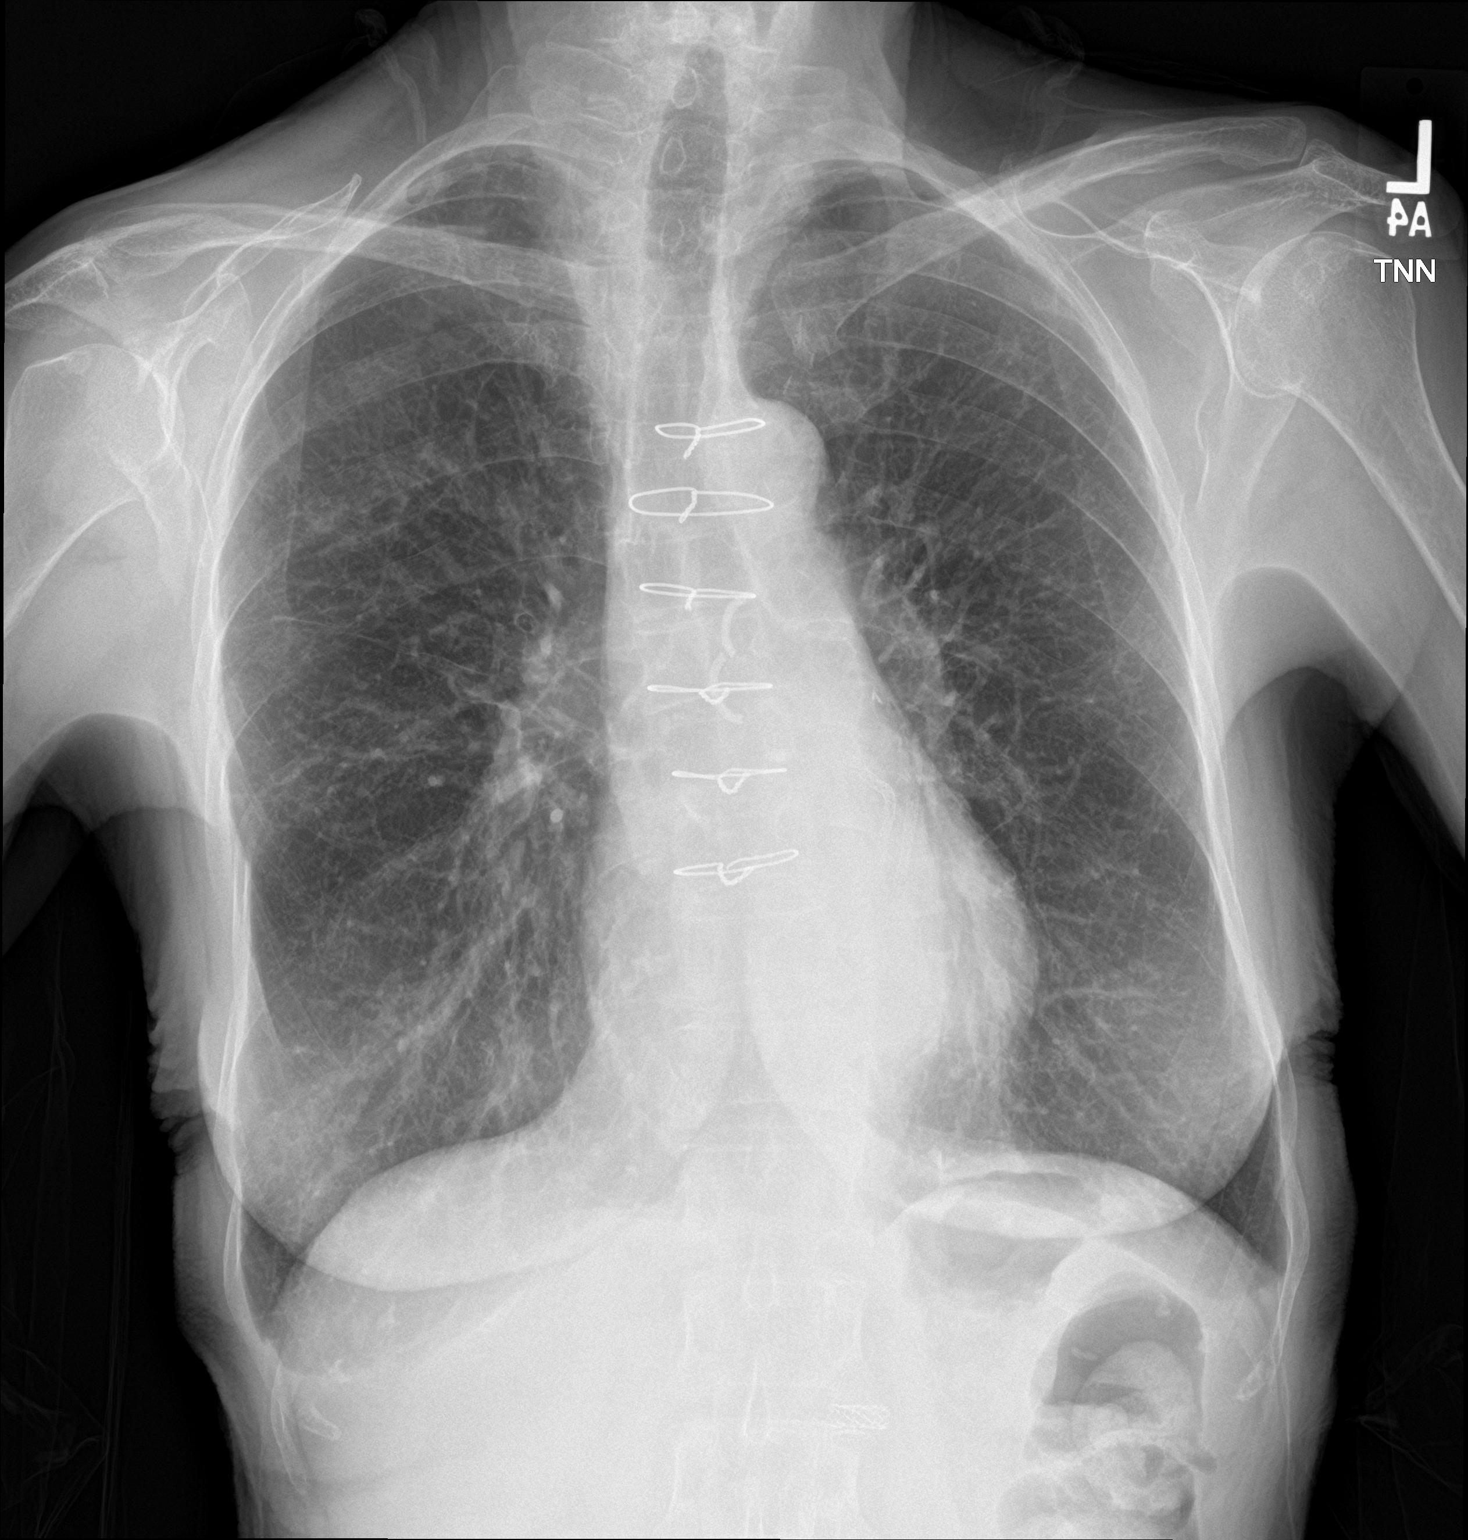

[chest lat]
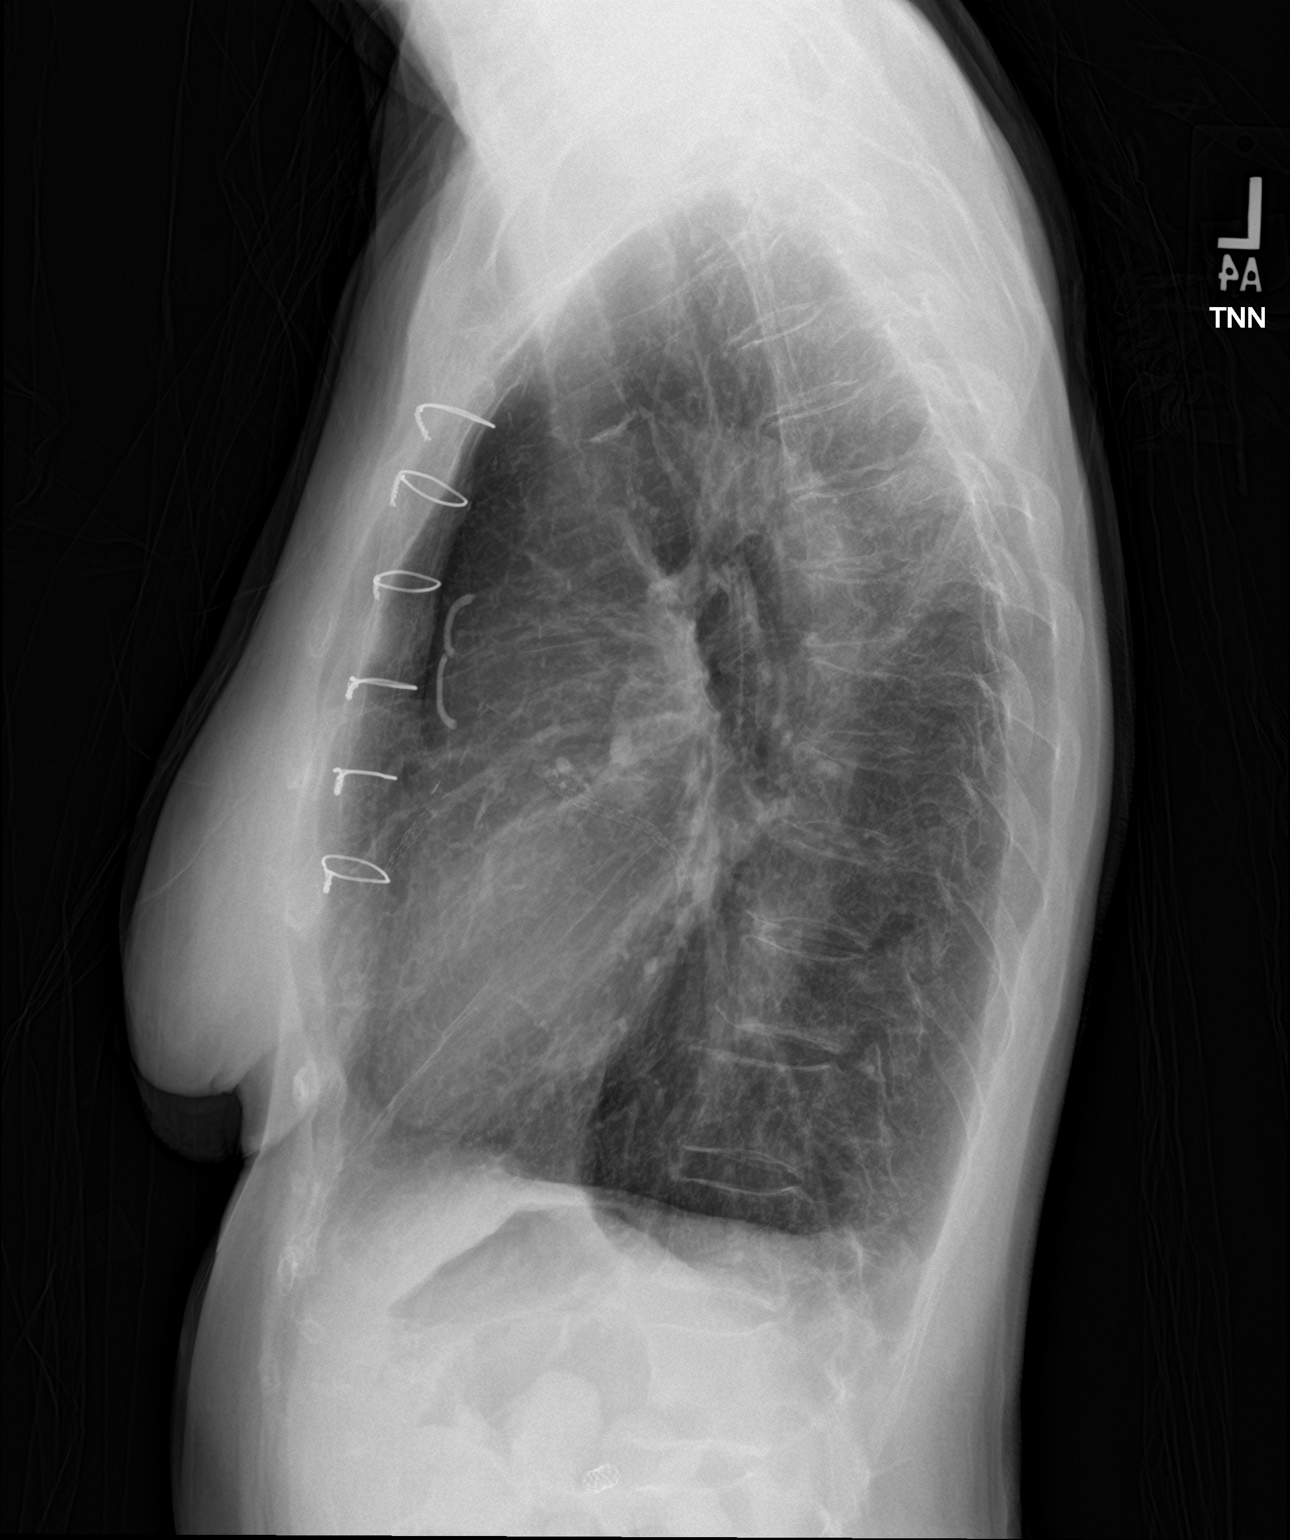

[2 of 2 positions shown; findings below may reference images not displayed]

FINDINGS: Cardiac shadow is within normal limits. The lungs are hyperinflated
bilaterally. No focal infiltrate or sizable effusion is seen.
Postsurgical changes are again noted. Patchy scarring is noted in
the right upper lobe stable from the previous exam. No acute bony
abnormality is noted.
IMPRESSION: COPD without acute abnormality.

## 2018-07-08 ENCOUNTER — Telehealth: Payer: Self-pay

## 2018-07-08 NOTE — Telephone Encounter (Signed)
Called patient from recall list.  No answer. LMOV.  This is the 2nd attempt per recall list.

## 2018-07-20 NOTE — Telephone Encounter (Signed)
Called patient from recall.  No answer. LMOV.  This is the 3rd attempt per recall list.

## 2018-08-15 ENCOUNTER — Other Ambulatory Visit: Payer: Self-pay

## 2018-08-15 ENCOUNTER — Emergency Department
Admission: EM | Admit: 2018-08-15 | Discharge: 2018-08-15 | Disposition: A | Discharge status: Home / Self Care | Payer: Medicare Other | Attending: Emergency Medicine | Admitting: Emergency Medicine

## 2018-08-15 ENCOUNTER — Encounter: Payer: Self-pay | Admitting: Emergency Medicine

## 2018-08-15 ENCOUNTER — Emergency Department: Payer: Medicare Other

## 2018-08-15 DIAGNOSIS — I10 Essential (primary) hypertension: Secondary | ICD-10-CM | POA: Insufficient documentation

## 2018-08-15 DIAGNOSIS — I2510 Atherosclerotic heart disease of native coronary artery without angina pectoris: Secondary | ICD-10-CM | POA: Insufficient documentation

## 2018-08-15 DIAGNOSIS — R079 Chest pain, unspecified: Secondary | ICD-10-CM | POA: Insufficient documentation

## 2018-08-15 DIAGNOSIS — E785 Hyperlipidemia, unspecified: Secondary | ICD-10-CM | POA: Insufficient documentation

## 2018-08-15 DIAGNOSIS — Z79899 Other long term (current) drug therapy: Secondary | ICD-10-CM | POA: Insufficient documentation

## 2018-08-15 DIAGNOSIS — Z7982 Long term (current) use of aspirin: Secondary | ICD-10-CM | POA: Insufficient documentation

## 2018-08-15 LAB — CBC
HCT: 43.7 % (ref 36.0–46.0)
Hemoglobin: 14 g/dL (ref 12.0–15.0)
MCH: 30.4 pg (ref 26.0–34.0)
MCHC: 32 g/dL (ref 30.0–36.0)
MCV: 95 fL (ref 80.0–100.0)
Platelets: 161 10*3/uL (ref 150–400)
RBC: 4.6 MIL/uL (ref 3.87–5.11)
RDW: 13.2 % (ref 11.5–15.5)
WBC: 5.6 10*3/uL (ref 4.0–10.5)
nRBC: 0 % (ref 0.0–0.2)

## 2018-08-15 LAB — BASIC METABOLIC PANEL
Anion gap: 9 (ref 5–15)
BUN: 18 mg/dL (ref 8–23)
CO2: 28 mmol/L (ref 22–32)
Calcium: 9.2 mg/dL (ref 8.9–10.3)
Chloride: 101 mmol/L (ref 98–111)
Creatinine, Ser: 0.95 mg/dL (ref 0.44–1.00)
GFR calc Af Amer: >60 mL/min (ref >60)
GFR calc non Af Amer: 55 mL/min — ABNORMAL LOW (ref >60)
Glucose, Bld: 138 mg/dL — ABNORMAL HIGH (ref 70–99)
Potassium: 3.6 mmol/L (ref 3.5–5.1)
Sodium: 138 mmol/L (ref 135–145)

## 2018-08-15 LAB — TROPONIN I
Troponin I: <0.03 ng/mL (ref <0.03)
Troponin I: <0.03 ng/mL (ref <0.03)

## 2018-08-15 MED ORDER — SODIUM CHLORIDE 0.9% FLUSH
3.0000 mL | Freq: Once | INTRAVENOUS | Status: AC
  Administered 2018-08-15: 19:00:00 3 mL via INTRAVENOUS

## 2018-08-15 NOTE — ED Triage Notes (Signed)
Chest pain that began 2 hours ago. Patient took 1 baby asa and 2 nitro's prior to EMS arrival.reports being chest pain free at present time.

## 2018-08-15 NOTE — ED Provider Notes (Signed)
[facility] Emergency Department Provider Note   ____________________________________________   First MD Initiated Contact with Patient 08/15/18 1847     (approximate)  I have reviewed the triage vital signs and the nursing notes.   HISTORY  Chief Complaint Chest Pain    HPI Sally Miller is a 85 y.o. female who reports chest pain beginning about 2 hours prior to arrival.  Her last about a half an hour.  It was heavy and tight.  No sweating, no real nausea but some slight shortness of breath.  No real radiation.  Patient took some nitro and the pain is now gone.  Pain was heavy and tight in the middle of her chest.  Was moderate in intensity         Past Medical History:  Diagnosis Date  . CAD (coronary artery disease)    a. S/P prior LAD/LCX stenting;  b. S/P prior CABG x 3; c. 06/2012 Cath: LM 20, LAD 100p, 3m ISR, LCX 20p ISR, 42m ISR, RCA 100 CTO, patent LIMA to LAD, SVG to diag2, SVG to OM2.   Marland Kitchen HTN (hypertension)   . Hyperlipemia   . MI (myocardial infarction) ([place])   . Syncope    a. 06/2012.    Patient Active Problem List   Diagnosis Date Noted  . SOB (shortness of breath) 05/07/2017  . Hx of CABG   . Elevated troponin   . Chest pain 04/22/2016  . Coronary artery disease of native artery of native heart with stable angina pectoris ([place]) 06/23/2010  . Essential hypertension 06/23/2010  . Mixed hyperlipidemia 06/23/2010  . Fatigue 06/23/2010    Past Surgical History:  Procedure Laterality Date  . CATARACT EXTRACTION, BILATERAL    . CORONARY ANGIOPLASTY    . CORONARY ARTERY BYPASS GRAFT  1996  . RENAL ARTERY STENT      Prior to Admission medications   Medication Sig Start Date End Date Taking? Authorizing Provider  aspirin 81 MG tablet Take 1 tablet (81 mg total) by mouth daily. 12/26/12   Nahser, Wonda Cheng, MD  clopidogrel (PLAVIX) 75 MG tablet Take 1 tablet (75 mg total) by mouth daily. 04/24/16   Gladstone Lighter, MD   Cyanocobalamin (VITAMIN B 12) 100 MCG LOZG Take by mouth daily.    [provider]  ezetimibe (ZETIA) 10 MG tablet TAKE 1 TABLET BY MOUTH ONCE DAILY 11/01/17   Minna Merritts, MD  feeding supplement, ENSURE ENLIVE, (ENSURE ENLIVE) LIQD Take 237 mLs by mouth 2 (two) times daily between meals. 04/24/16   Gladstone Lighter, MD  fluticasone (FLONASE) 50 MCG/ACT nasal spray Place 1 spray into both nostrils daily. 01/21/16   [provider]  isosorbide mononitrate (IMDUR) 30 MG 24 hr tablet Take 1 tablet (30 mg total) by mouth daily. 04/24/16   Gladstone Lighter, MD  metoprolol tartrate (LOPRESSOR) 25 MG tablet Take 1 tablet (25 mg total) by mouth 2 (two) times daily. 04/24/16   Gladstone Lighter, MD  Multiple Vitamins-Minerals (MULTI ADULT GUMMIES PO) Take 1 tablet by mouth daily.    [provider]  Multiple Vitamins-Minerals (PRESERVISION AREDS PO) Take 1 capsule by mouth 2 (two) times daily.    [provider]  nitroGLYCERIN (NITROSTAT) 0.4 MG SL tablet Place 1 tablet (0.4 mg total) under the tongue every 5 (five) minutes as needed. 04/24/16   Gladstone Lighter, MD  Olopatadine HCl 0.2 % SOLN Place 1 drop into both eyes daily.  04/12/16   [provider]  rosuvastatin (CRESTOR) 40 MG tablet Take 40 mg by mouth daily.    [provider]    Allergies Other  Family History  Problem Relation Age of Onset  . Heart attack Brother   . Heart attack Brother   . Heart failure Sister   . Coronary artery disease Sister   . Coronary artery disease Brother        had cabg    Social History Social History   Tobacco Use  . Smoking status: Never Smoker  . Smokeless tobacco: Never Used  Substance Use Topics  . Alcohol use: No  . Drug use: No    Review of Systems  Constitutional: No fever/chills Eyes: No visual changes. ENT: No sore throat. Cardiovascular: Denies chest pain currently. Respiratory: Denies shortness of breath. Gastrointestinal:  No abdominal pain.  No nausea, no vomiting.  No diarrhea.  No constipation. Genitourinary: Negative for dysuria. Musculoskeletal: Negative for back pain. Skin: Negative for rash. Neurological: Negative for headaches, focal weakness  ____________________________________________   PHYSICAL EXAM:  VITAL SIGNS: ED Triage Vitals  Enc Vitals Group     BP 08/15/18 1848 (!) 196/102     Pulse Rate 08/15/18 1900 (!) 105     Resp 08/15/18 1848 18     Temp 08/15/18 1848 97.8 F (36.6 C)     Temp Source 08/15/18 1848 Oral     SpO2 08/15/18 1848 98 %     Weight 08/15/18 1850 87 lb (39.5 kg)     Height 08/15/18 1850 5\' 1"  (1.549 m)     Head Circumference --      Peak Flow --      Pain Score 08/15/18 1850 0     Pain Loc --      Pain Edu? --      Excl. in [place]? --    Constitutional: Alert and oriented. Well appearing and in no acute distress. Eyes: Conjunctivae are normal.  Head: Atraumatic. Nose: No congestion/rhinnorhea. Mouth/Throat: Mucous membranes are moist.  Oropharynx non-erythematous. Neck: No stridor.  Cardiovascular: Normal rate, regular rhythm. Grossly normal heart sounds.  Good peripheral circulation. Respiratory: Normal respiratory effort.  No retractions. Lungs CTAB. Gastrointestinal: Soft and nontender. No distention. No abdominal bruits. No CVA tenderness. Musculoskeletal: No lower extremity tenderness nor edema.   Neurologic:  Normal speech and language. No gross focal neurologic deficits are appreciated. No gait instability. Skin:  Skin is warm, dry and intact. No rash noted.   ____________________________________________   LABS (all labs ordered are listed, but only abnormal results are displayed)  Labs Reviewed  BASIC METABOLIC PANEL - Abnormal; Notable for the following components:      Result Value   Glucose, Bld 138 (*)    GFR calc non Af Amer 55 (*)    All other components within normal limits  CBC  TROPONIN I  TROPONIN I    ____________________________________________  EKG EKG read interpreted by me shows normal sinus rhythm rate of 92 normal axis there are some flipped T's inferiorly in 2 3 and F but these were present on EKG done on her arrival and have not changed.  Additionally they were present on EKGs done in February 2018 and November 2016. ____________________________________________  [place]  ED MD interpretation: Chest x-ray read by radiology reviewed by me shows no acute disease  Official radiology report(s): Dg Chest 2 View  Result Date: 08/15/2018 CLINICAL DATA:  85 year old female with chest pain. EXAM: CHEST - 2 VIEW COMPARISON:  Chest radiograph dated 04/22/2016  FINDINGS: There is emphysematous changes of the lungs. There is no focal consolidation, pleural effusion, or pneumothorax. The cardiac silhouette is within normal limits. Coronary vascular calcification and stents noted. There is median sternotomy wires. Atherosclerotic calcification of the aortic arch. The bones are osteopenic. No acute osseous pathology. Vascular stent in the upper abdomen likely in the left renal ostia. IMPRESSION: No active cardiopulmonary disease. Electronically Signed   By: Anner Crete M.D.   On: 08/15/2018 19:33    ____________________________________________   PROCEDURES  Procedure(s) performed (including Critical Care):  Procedures   ____________________________________________   INITIAL IMPRESSION / ASSESSMENT AND PLAN / ED COURSE Patient has no further chest pain has not had chest pain really since she arrived.  She wants to go home I think with the 2- troponins and EKG which is basically stable it would be safe to let her do so.  She will return for any further problems.  We will have her follow-up with her cardiologist either in the morning or the next day.              ____________________________________________   FINAL CLINICAL IMPRESSION(S) / ED DIAGNOSES  Final diagnoses:   Chest pain, unspecified type     ED Discharge Orders    None       Note:  This document was prepared using Dragon voice recognition software and may include unintentional dictation errors.    Nena Polio, MD 08/15/18 2202

## 2018-08-15 NOTE — Discharge Instructions (Signed)
Since Dr. Rockey Situ is your cardiologist please give his office a call in the morning.  Let him know that you were in the emergency room with chest pain last night.  They should be oh to get you in fairly quickly.  If they cannot you can also call Dr. Harrell Gave.  His office is actually on-call for Dr. Rockey Situ tonight.  Please return here for any further chest pain or shortness of breath or other problems.

## 2018-12-01 NOTE — Progress Notes (Deleted)
Cardiology Office Note  Date:  12/01/2018   ID:  Sally Miller, DOB 1930/04/16, MRN [id number]  PCP:  Derinda Late, MD   No chief complaint on file.   HPI:  85 yo woman with PMH of Coronary artery disease- s/p CABG 1996 native right coronary artery was chronically occluded, collaterals.  patent LIMA to LAD, SVG to d, SVG to OM.  Hypertension Hyperlipidemia Syncope (dehydrated?) Chronic SOB Who presents for routine follow-up of her coronary artery disease  Lab work reviewed Total cholesterol 179, LDL 102 Tolerating Crestor and Zetia  In follow-up today she is walking 1 mile on the treadmill every other day with no chest pain Feels that her shortness of breath is improving Tolerating her medications without symptoms No recent falls  Reports blood pressures well controlled  Has not had the flu shot yet Reports she is losing muscle mass  EKG personally reviewed by myself on todays visit Shows normal sinus rhythm rate 75 bpm no significant ST or T wave changes  Other past medical history reviewed emergency room, kept in the hospital February 2018 for chest pain Elevated troponin secondary to poor control blood pressure She declined cardiac catheterization Stress test could not be done secondary to previous "allergy" Imdur was added For angina and blood pressure   PMH:   has a past medical history of CAD (coronary artery disease), HTN (hypertension), Hyperlipemia, MI (myocardial infarction) ([place]), and Syncope.  PSH:    Past Surgical History:  Procedure Laterality Date  . CATARACT EXTRACTION, BILATERAL    . CORONARY ANGIOPLASTY    . CORONARY ARTERY BYPASS GRAFT  1996  . RENAL ARTERY STENT      Current Outpatient Medications  Medication Sig Dispense Refill  . aspirin 81 MG tablet Take 1 tablet (81 mg total) by mouth daily. 30 tablet 6  . clopidogrel (PLAVIX) 75 MG tablet Take 1 tablet (75 mg total) by mouth daily. 30 tablet 2  . Cyanocobalamin (VITAMIN B 12) 100  MCG LOZG Take by mouth daily.    Marland Kitchen ezetimibe (ZETIA) 10 MG tablet TAKE 1 TABLET BY MOUTH ONCE DAILY 90 tablet 2  . feeding supplement, ENSURE ENLIVE, (ENSURE ENLIVE) LIQD Take 237 mLs by mouth 2 (two) times daily between meals. 237 mL 12  . fluticasone (FLONASE) 50 MCG/ACT nasal spray Place 1 spray into both nostrils daily.    . isosorbide mononitrate (IMDUR) 30 MG 24 hr tablet Take 1 tablet (30 mg total) by mouth daily. 30 tablet 2  . metoprolol tartrate (LOPRESSOR) 25 MG tablet Take 1 tablet (25 mg total) by mouth 2 (two) times daily. 60 tablet 2  . Multiple Vitamins-Minerals (MULTI ADULT GUMMIES PO) Take 1 tablet by mouth daily.    . Multiple Vitamins-Minerals (PRESERVISION AREDS PO) Take 1 capsule by mouth 2 (two) times daily.    . nitroGLYCERIN (NITROSTAT) 0.4 MG SL tablet Place 1 tablet (0.4 mg total) under the tongue every 5 (five) minutes as needed. 30 tablet 3  . Olopatadine HCl 0.2 % SOLN Place 1 drop into both eyes daily.     . rosuvastatin (CRESTOR) 40 MG tablet Take 40 mg by mouth daily.     No current facility-administered medications for this visit.      Allergies:   Other   Social History:  The patient  reports that she has never smoked. She has never used smokeless tobacco. She reports that she does not drink alcohol or use drugs.   Family History:   family history includes  Coronary artery disease in her brother and sister; Heart attack in her brother and brother; Heart failure in her sister.    Review of Systems: Review of Systems  Constitutional: Negative.   Respiratory: Negative.   Cardiovascular: Negative.   Gastrointestinal: Negative.   Musculoskeletal: Negative.   Neurological: Negative.   Psychiatric/Behavioral: Negative.   All other systems reviewed and are negative.    PHYSICAL EXAM: VS:  There were no vitals taken for this visit. , BMI There is no height or weight on file to calculate BMI. Constitutional:  oriented to person, place, and time. No  distress.  HENT:  Head: Normocephalic and atraumatic.  Eyes:  no discharge. No scleral icterus.  Neck: Normal range of motion. Neck supple. No JVD present.  Cardiovascular: Normal rate, regular rhythm, normal heart sounds and intact distal pulses. Exam reveals no gallop and no friction rub. No edema No murmur heard. Pulmonary/Chest: Effort normal and breath sounds normal. No stridor. No respiratory distress.  no wheezes.  no rales.  no tenderness.  Abdominal: Soft.  no distension.  no tenderness.  Musculoskeletal: Normal range of motion.  no  tenderness or deformity.  Neurological:  normal muscle tone. Coordination normal. No atrophy Skin: Skin is warm and dry. No rash noted. not diaphoretic.  Psychiatric:  normal mood and affect. behavior is normal. Thought content normal.    Recent Labs: 08/15/2018: BUN 18; Creatinine, Ser 0.95; Hemoglobin 14.0; Platelets 161; Potassium 3.6; Sodium 138    Lipid Panel Lab Results  Component Value Date   CHOL 208 (H) 07/12/2014   HDL 76 07/12/2014   LDLCALC 115 (H) 07/12/2014   TRIG 86 07/12/2014      Wt Readings from Last 3 Encounters:  08/15/18 87 lb (39.5 kg)  05/07/17 87 lb 12 oz (39.8 kg)  10/26/16 90 lb (40.8 kg)       ASSESSMENT AND PLAN:  Coronary artery disease of native artery of native heart with stable angina pectoris (HCC) regular exercise program, no symptoms concerning for unstable angina We will continue her current medication regimen  Essential hypertension Blood pressure is well controlled on today's visit. No changes made to the medications. She does have renal artery stenosis stable Stable  Mixed hyperlipidemia LDL of 100 , best numbers she has had in the recent past few years continue Zetia with Crestor  Hx of CABG Currently with no symptoms of angina. No further workup at this time. Continue current medication regimen.  Stable, regular exercise  PAD Previous stenosis to the right renal artery on previous  catheterization 2014 also stent to the iliac vessel/artery  Previously declined ultrasound Denies any claudication type symptoms Using treadmill every other day 1 mile   flu shot provided today   Total encounter time more than 25 minutes  Greater than 50% was spent in counseling and coordination of care with the patient   Disposition:   F/U  12 months   No orders of the defined types were placed in this encounter.    Signed, Esmond Plants, M.D., Ph.D. 12/01/2018  [place], [place]

## 2018-12-05 ENCOUNTER — Ambulatory Visit: Payer: Medicare Other | Admitting: Cardiovascular Disease

## 2018-12-05 ENCOUNTER — Telehealth: Payer: Self-pay | Admitting: Cardiovascular Disease

## 2018-12-05 ENCOUNTER — Other Ambulatory Visit: Payer: Self-pay | Admitting: Cardiovascular Disease

## 2018-12-05 MED ORDER — ISOSORBIDE MONONITRATE ER 30 MG PO TB24: 30.0000 mg | ORAL_TABLET | Freq: Every day | ORAL | 1 refills | Status: DC

## 2018-12-05 NOTE — Telephone Encounter (Signed)
°*  STAT* If patient is at the pharmacy, call can be transferred to refill team.   1. Which medications need to be refilled? (please list name of each medication and dose if known) isosorbide 30mg  daily  2. Which pharmacy/location (including street and city if local pharmacy) is medication to be sent to? [facility] on [address]  3. Do they need a 30 day or 90 day supply? [place]

## 2018-12-07 ENCOUNTER — Telehealth: Payer: Medicare Other | Admitting: Cardiovascular Disease

## 2019-01-07 NOTE — Progress Notes (Signed)
Cardiology Office Note  Date:  01/09/2019   ID:  Arienna Kealy, DOB Feb 20, 1931, MRN [id number]  PCP:  Derinda Late, MD   Chief Complaint  Patient presents with  . Other    Hospital follow up. Patient denies chest pain and SOB. Meds reviewed verbally with patient.     HPI:  85 yo woman with PMH of Coronary artery disease- s/p CABG 1996 native right coronary artery was chronically occluded, collaterals.  patent LIMA to LAD, SVG to d, SVG to OM.  Hypertension Hyperlipidemia Syncope (dehydrated?) Chronic SOB Who presents for routine follow-up of her coronary artery disease  Seen in the emergency room August 15, 2018 for chest pain Hospital records reviewed Denies chest pain Was hypertensive 196/102 heart rate 105 Cardiac enzymes negative Was eating pretzels, Blurred vision  Active at home with no SOB or chest pain  No recent stress test  Blood pressure "ok at home"  Lab work reviewed Total cholesterol 179, LDL 98 Tolerating Crestor and Zetia  Stopped treadmill No recent falls  EKG personally reviewed by myself on todays visit Shows normal sinus rhythm rate 82 bpm no significant ST or T wave changes  Other past medical history reviewed emergency room, kept in the hospital February 2018 for chest pain Elevated troponin secondary to poor control blood pressure She declined cardiac catheterization Stress test could not be done secondary to previous "allergy" Imdur was added For angina and blood pressure   PMH:   has a past medical history of CAD (coronary artery disease), HTN (hypertension), Hyperlipemia, MI (myocardial infarction) ([place]), and Syncope.  PSH:    Past Surgical History:  Procedure Laterality Date  . CATARACT EXTRACTION, BILATERAL    . CORONARY ANGIOPLASTY    . CORONARY ARTERY BYPASS GRAFT  1996  . RENAL ARTERY STENT      Current Outpatient Medications  Medication Sig Dispense Refill  . aspirin 81 MG tablet Take 1 tablet (81 mg total) by mouth  daily. 30 tablet 6  . clopidogrel (PLAVIX) 75 MG tablet Take 1 tablet (75 mg total) by mouth daily. 30 tablet 2  . Cyanocobalamin (VITAMIN B 12) 100 MCG LOZG Take by mouth daily.    Marland Kitchen ezetimibe (ZETIA) 10 MG tablet TAKE 1 TABLET BY MOUTH ONCE DAILY 90 tablet 2  . feeding supplement, ENSURE ENLIVE, (ENSURE ENLIVE) LIQD Take 237 mLs by mouth 2 (two) times daily between meals. 237 mL 12  . fluticasone (FLONASE) 50 MCG/ACT nasal spray Place 1 spray into both nostrils daily.    . isosorbide mononitrate (IMDUR) 30 MG 24 hr tablet Take 1 tablet (30 mg total) by mouth daily. Must keep appointment as scheduled for future refills. 30 tablet 1  . metoprolol tartrate (LOPRESSOR) 25 MG tablet Take 1 tablet (25 mg total) by mouth 2 (two) times daily. 60 tablet 2  . Multiple Vitamins-Minerals (MULTI ADULT GUMMIES PO) Take 1 tablet by mouth daily.    . Multiple Vitamins-Minerals (PRESERVISION AREDS PO) Take 1 capsule by mouth 2 (two) times daily.    . nitroGLYCERIN (NITROSTAT) 0.4 MG SL tablet Place 1 tablet (0.4 mg total) under the tongue every 5 (five) minutes as needed. 30 tablet 3  . Olopatadine HCl 0.2 % SOLN Place 1 drop into both eyes daily.     . rosuvastatin (CRESTOR) 40 MG tablet Take 40 mg by mouth daily.     No current facility-administered medications for this visit.      Allergies:   Other   Social History:  The patient  reports that she has never smoked. She has never used smokeless tobacco. She reports that she does not drink alcohol or use drugs.   Family History:   family history includes Coronary artery disease in her brother and sister; Heart attack in her brother and brother; Heart failure in her sister.    Review of Systems: Review of Systems  Constitutional: Negative.   Respiratory: Negative.   Cardiovascular: Negative.   Gastrointestinal: Negative.   Musculoskeletal: Negative.   Neurological: Negative.   Psychiatric/Behavioral: Negative.   All other systems reviewed and are  negative.    PHYSICAL EXAM: VS:  BP 138/90 (BP Location: Left Arm, Patient Position: Sitting, Cuff Size: Normal)   Ht 5\' 1"  (1.549 m)   Wt 84 lb (38.1 kg)   BMI 15.87 kg/m  , BMI Body mass index is 15.87 kg/m. Constitutional:  oriented to person, place, and time. No distress.  Thin HENT:  Head: Grossly normal Eyes:  no discharge. No scleral icterus.  Neck: No JVD, no carotid bruits  Cardiovascular: Regular rate and rhythm, no murmurs appreciated Pulmonary/Chest: Clear to auscultation bilaterally, no wheezes or rails Abdominal: Soft.  no distension.  no tenderness.  Musculoskeletal: Normal range of motion Neurological:  normal muscle tone. Coordination normal. No atrophy Skin: Skin warm and dry Psychiatric: normal affect, pleasant  Recent Labs: 08/15/2018: BUN 18; Creatinine, Ser 0.95; Hemoglobin 14.0; Platelets 161; Potassium 3.6; Sodium 138    Lipid Panel Lab Results  Component Value Date   CHOL 208 (H) 07/12/2014   HDL 76 07/12/2014   LDLCALC 115 (H) 07/12/2014   TRIG 86 07/12/2014    Wt Readings from Last 3 Encounters:  01/09/19 84 lb (38.1 kg)  08/15/18 87 lb (39.5 kg)  05/07/17 87 lb 12 oz (39.8 kg)     ASSESSMENT AND PLAN:  Coronary artery disease of native artery of native heart with stable angina pectoris ([place]) Denies any recent chest pain concerning for angina Even on recent trip to the emergency room June 2020 denied having chest pain only elevated blood pressure No further testing at this time  Essential hypertension Blood pressure well controlled on current medications Notes indicating renal artery stenosis  Stable.  No further work-up at this time  Mixed hyperlipidemia continue Zetia with Crestor Daily goal LDL less than 70 Weight has been dropping, no further changes to her medications  Hx of CABG Denies angina, active at baseline Declining any testing  PAD Previous stenosis to the right renal artery on previous catheterization 2014 also  stent to the iliac vessel/artery  Previously declined ultrasound Blood pressure stable, no claudication symptoms   flu shot provided today   Total encounter time more than 25 minutes  Greater than 50% was spent in counseling and coordination of care with the patient   Disposition:   F/U  6 months   Orders Placed This Encounter  Procedures  . EKG 12-Lead     Signed, Esmond Plants, M.D., Ph.D. 01/09/2019  [place], [place]

## 2019-01-09 ENCOUNTER — Other Ambulatory Visit: Payer: Self-pay

## 2019-01-09 ENCOUNTER — Ambulatory Visit (INDEPENDENT_AMBULATORY_CARE_PROVIDER_SITE_OTHER): Payer: Medicare Other | Admitting: Cardiovascular Disease

## 2019-01-09 ENCOUNTER — Encounter: Payer: Self-pay | Admitting: Cardiovascular Disease

## 2019-01-09 VITALS — BP 138/90 | Ht 61.0 in | Wt 84.0 lb

## 2019-01-09 DIAGNOSIS — I10 Essential (primary) hypertension: Secondary | ICD-10-CM

## 2019-01-09 DIAGNOSIS — R0602 Shortness of breath: Secondary | ICD-10-CM | POA: Diagnosis not present

## 2019-01-09 DIAGNOSIS — I25118 Atherosclerotic heart disease of native coronary artery with other forms of angina pectoris: Secondary | ICD-10-CM | POA: Diagnosis not present

## 2019-01-09 DIAGNOSIS — Z951 Presence of aortocoronary bypass graft: Secondary | ICD-10-CM

## 2019-01-09 DIAGNOSIS — Z23 Encounter for immunization: Secondary | ICD-10-CM | POA: Diagnosis not present

## 2019-01-09 DIAGNOSIS — E785 Hyperlipidemia, unspecified: Secondary | ICD-10-CM

## 2019-01-09 NOTE — Patient Instructions (Addendum)
Flu shot   Medication Instructions:  No changes  If you need a refill on your cardiac medications before your next appointment, please call your pharmacy.    Lab work: No new labs needed   If you have labs (blood work) drawn today and your tests are completely normal, you will receive your results only by: Marland Kitchen MyChart Message (if you have MyChart) OR . A paper copy in the mail If you have any lab test that is abnormal or we need to change your treatment, we will call you to review the results.   Testing/Procedures: No new testing needed   Follow-Up: At [facility], you and your health needs are our priority.  As part of our continuing mission to provide you with exceptional heart care, we have created designated Provider Care Teams.  These Care Teams include your primary Cardiologist (physician) and Advanced Practice Providers (APPs -  Physician Assistants and Nurse Practitioners) who all work together to provide you with the care you need, when you need it.  . You will need a follow up appointment in 6 months .   Please call our office 2 months in advance to schedule this appointment.    . Providers on your designated Care Team:   . Murray Hodgkins, NP . Christell Faith, PA-C . Marrianne Mood, PA-C  Any Other Special Instructions Will Be Listed Below (If Applicable).  For educational health videos Log in to : [email or web address] Or : [email or web address], password : [email or web address]

## 2019-05-21 ENCOUNTER — Other Ambulatory Visit: Payer: Self-pay | Admitting: Cardiovascular Disease

## 2019-07-26 ENCOUNTER — Other Ambulatory Visit: Payer: Self-pay | Admitting: Cardiovascular Disease

## 2019-08-06 NOTE — Progress Notes (Deleted)
Cardiology Office Note  Date:  08/06/2019   ID:  Sally Miller, DOB Mar 17, 1930, MRN [id number]  PCP:  Derinda Late, MD   No chief complaint on file.   HPI:  86 yo woman with PMH of Coronary artery disease- s/p CABG 1996 native right coronary artery was chronically occluded, collaterals.  patent LIMA to LAD, SVG to d, SVG to OM.  Hypertension Hyperlipidemia Syncope (dehydrated?) Chronic SOB Who presents for routine follow-up of her coronary artery disease  Seen in the emergency room August 15, 2018 for chest pain Hospital records reviewed Denies chest pain Was hypertensive 196/102 heart rate 105 Cardiac enzymes negative Was eating pretzels, Blurred vision  Active at home with no SOB or chest pain  No recent stress test  Blood pressure "ok at home"  Lab work reviewed Total cholesterol 179, LDL 98 Tolerating Crestor and Zetia  Stopped treadmill No recent falls  EKG personally reviewed by myself on todays visit Shows normal sinus rhythm rate 82 bpm no significant ST or T wave changes  Other past medical history reviewed emergency room, kept in the hospital February 2018 for chest pain Elevated troponin secondary to poor control blood pressure She declined cardiac catheterization Stress test could not be done secondary to previous "allergy" Imdur was added For angina and blood pressure   PMH:   has a past medical history of CAD (coronary artery disease), HTN (hypertension), Hyperlipemia, MI (myocardial infarction) ([place]), and Syncope.  PSH:    Past Surgical History:  Procedure Laterality Date  . CATARACT EXTRACTION, BILATERAL    . CORONARY ANGIOPLASTY    . CORONARY ARTERY BYPASS GRAFT  1996  . RENAL ARTERY STENT      Current Outpatient Medications  Medication Sig Dispense Refill  . aspirin 81 MG tablet Take 1 tablet (81 mg total) by mouth daily. 30 tablet 6  . clopidogrel (PLAVIX) 75 MG tablet Take 1 tablet (75 mg total) by mouth daily. 30 tablet 2  .  Cyanocobalamin (VITAMIN B 12) 100 MCG LOZG Take by mouth daily.    Marland Kitchen ezetimibe (ZETIA) 10 MG tablet TAKE 1 TABLET BY MOUTH ONCE DAILY 90 tablet 2  . feeding supplement, ENSURE ENLIVE, (ENSURE ENLIVE) LIQD Take 237 mLs by mouth 2 (two) times daily between meals. 237 mL 12  . fluticasone (FLONASE) 50 MCG/ACT nasal spray Place 1 spray into both nostrils daily.    . isosorbide mononitrate (IMDUR) 30 MG 24 hr tablet Take 1 tablet (30 mg total) by mouth daily. 90 tablet 3  . metoprolol tartrate (LOPRESSOR) 25 MG tablet Take 1 tablet (25 mg total) by mouth 2 (two) times daily. 60 tablet 2  . Multiple Vitamins-Minerals (MULTI ADULT GUMMIES PO) Take 1 tablet by mouth daily.    . Multiple Vitamins-Minerals (PRESERVISION AREDS PO) Take 1 capsule by mouth 2 (two) times daily.    . nitroGLYCERIN (NITROSTAT) 0.4 MG SL tablet Place 1 tablet (0.4 mg total) under the tongue every 5 (five) minutes as needed. 30 tablet 3  . Olopatadine HCl 0.2 % SOLN Place 1 drop into both eyes daily.     . rosuvastatin (CRESTOR) 40 MG tablet Take 40 mg by mouth daily.     No current facility-administered medications for this visit.     Allergies:   Other   Social History:  The patient  reports that she has never smoked. She has never used smokeless tobacco. She reports that she does not drink alcohol or use drugs.   Family History:  family history includes Coronary artery disease in her brother and sister; Heart attack in her brother and brother; Heart failure in her sister.    Review of Systems: Review of Systems  Constitutional: Negative.   Respiratory: Negative.   Cardiovascular: Negative.   Gastrointestinal: Negative.   Musculoskeletal: Negative.   Neurological: Negative.   Psychiatric/Behavioral: Negative.   All other systems reviewed and are negative.    PHYSICAL EXAM: VS:  There were no vitals taken for this visit. , BMI There is no height or weight on file to calculate BMI. Constitutional:  oriented to  person, place, and time. No distress.  Thin HENT:  Head: Grossly normal Eyes:  no discharge. No scleral icterus.  Neck: No JVD, no carotid bruits  Cardiovascular: Regular rate and rhythm, no murmurs appreciated Pulmonary/Chest: Clear to auscultation bilaterally, no wheezes or rails Abdominal: Soft.  no distension.  no tenderness.  Musculoskeletal: Normal range of motion Neurological:  normal muscle tone. Coordination normal. No atrophy Skin: Skin warm and dry Psychiatric: normal affect, pleasant  Recent Labs: 08/15/2018: BUN 18; Creatinine, Ser 0.95; Hemoglobin 14.0; Platelets 161; Potassium 3.6; Sodium 138    Lipid Panel Lab Results  Component Value Date   CHOL 208 (H) 07/12/2014   HDL 76 07/12/2014   LDLCALC 115 (H) 07/12/2014   TRIG 86 07/12/2014    Wt Readings from Last 3 Encounters:  01/09/19 84 lb (38.1 kg)  08/15/18 87 lb (39.5 kg)  05/07/17 87 lb 12 oz (39.8 kg)     ASSESSMENT AND PLAN:  Coronary artery disease of native artery of native heart with stable angina pectoris ([place]) Denies any recent chest pain concerning for angina Even on recent trip to the emergency room June 2020 denied having chest pain only elevated blood pressure No further testing at this time  Essential hypertension Blood pressure well controlled on current medications Notes indicating renal artery stenosis  Stable.  No further work-up at this time  Mixed hyperlipidemia continue Zetia with Crestor Daily goal LDL less than 70 Weight has been dropping, no further changes to her medications  Hx of CABG Denies angina, active at baseline Declining any testing  PAD Previous stenosis to the right renal artery on previous catheterization 2014 also stent to the iliac vessel/artery  Previously declined ultrasound Blood pressure stable, no claudication symptoms   flu shot provided today   Total encounter time more than 25 minutes  Greater than 50% was spent in counseling and coordination of  care with the patient   Disposition:   F/U  6 months   No orders of the defined types were placed in this encounter.    Signed, Esmond Plants, M.D., Ph.D. 08/06/2019  [place], [place]

## 2019-08-08 ENCOUNTER — Ambulatory Visit: Payer: Medicare Other | Admitting: Cardiovascular Disease

## 2019-08-31 ENCOUNTER — Other Ambulatory Visit: Payer: Self-pay

## 2019-08-31 ENCOUNTER — Ambulatory Visit (INDEPENDENT_AMBULATORY_CARE_PROVIDER_SITE_OTHER): Payer: Medicare Other | Admitting: Dermatology

## 2019-08-31 DIAGNOSIS — L578 Other skin changes due to chronic exposure to nonionizing radiation: Secondary | ICD-10-CM

## 2019-08-31 DIAGNOSIS — Z1283 Encounter for screening for malignant neoplasm of skin: Secondary | ICD-10-CM

## 2019-08-31 DIAGNOSIS — L821 Other seborrheic keratosis: Secondary | ICD-10-CM

## 2019-08-31 DIAGNOSIS — D1801 Hemangioma of skin and subcutaneous tissue: Secondary | ICD-10-CM | POA: Diagnosis not present

## 2019-08-31 DIAGNOSIS — L820 Inflamed seborrheic keratosis: Secondary | ICD-10-CM | POA: Diagnosis not present

## 2019-08-31 DIAGNOSIS — I25118 Atherosclerotic heart disease of native coronary artery with other forms of angina pectoris: Secondary | ICD-10-CM

## 2019-08-31 DIAGNOSIS — D229 Melanocytic nevi, unspecified: Secondary | ICD-10-CM

## 2019-08-31 DIAGNOSIS — L570 Actinic keratosis: Secondary | ICD-10-CM | POA: Diagnosis not present

## 2019-08-31 DIAGNOSIS — L814 Other melanin hyperpigmentation: Secondary | ICD-10-CM

## 2019-08-31 NOTE — Progress Notes (Signed)
° °  Follow-Up Visit   Subjective  Sally Miller is a 86 y.o. female who presents for the following: Annual Exam (Lesion on the R antecubital that is irregular and irritated. She has noticed other lesions on her face that she is concerned about that crust over and will not heal). The patient presents for Total-Body Skin Exam (TBSE) for skin cancer screening and mole check.  The following portions of the chart were reviewed this encounter and updated as appropriate:  Tobacco   Allergies   Meds   Problems   Med Hx   Surg Hx   Fam Hx      Review of Systems:  No other skin or systemic complaints except as noted in HPI or Assessment and Plan.  Objective  Well appearing patient in no apparent distress; mood and affect are within normal limits.  A full examination was performed including scalp, head, eyes, ears, nose, lips, neck, chest, axillae, abdomen, back, buttocks, bilateral upper extremities, bilateral lower extremities, hands, feet, fingers, toes, fingernails, and toenails. All findings within normal limits unless otherwise noted below.  Objective  Face (10): Erythematous thin papules/macules with gritty scale.   Objective  R antecubital x 1, L calf x 1 (2): Erythematous keratotic or waxy stuck-on papule or plaque.   Assessment & Plan    AK (actinic keratosis) (10) Face  Destruction of lesion - Face Complexity: simple   Destruction method: cryotherapy   Informed consent: discussed and consent obtained   Timeout:  patient name, date of birth, surgical site, and procedure verified Lesion destroyed using liquid nitrogen: Yes   Region frozen until ice ball extended beyond lesion: Yes   Outcome: patient tolerated procedure well with no complications   Post-procedure details: wound care instructions given    Inflamed seborrheic keratosis (2) R antecubital x 1, L calf x 1  Destruction of lesion - R antecubital x 1, L calf x 1 Complexity: simple   Destruction method: cryotherapy     Informed consent: discussed and consent obtained   Timeout:  patient name, date of birth, surgical site, and procedure verified Lesion destroyed using liquid nitrogen: Yes   Region frozen until ice ball extended beyond lesion: Yes   Outcome: patient tolerated procedure well with no complications   Post-procedure details: wound care instructions given    Skin cancer screening   Lentigines - Scattered tan macules - Discussed due to sun exposure - Benign, observe - Call for any changes  Seborrheic Keratoses - Stuck-on, waxy, tan-brown papules and plaques  - Discussed benign etiology and prognosis. - Observe - Call for any changes  Melanocytic Nevi - Tan-brown and/or pink-flesh-colored symmetric macules and papules - Benign appearing on exam today - Observation - Call clinic for new or changing moles - Recommend daily use of broad spectrum spf 30+ sunscreen to sun-exposed areas.   Hemangiomas - Red papules - Discussed benign nature - Observe - Call for any changes  Actinic Damage - diffuse scaly erythematous macules with underlying dyspigmentation - Recommend daily broad spectrum sunscreen SPF 30+ to sun-exposed areas, reapply every 2 hours as needed.  - Call for new or changing lesions.  Skin cancer screening performed today.   Return in about 2 months (around 10/31/2019).  Luther Redo, CMA, am acting as scribe for Sarina Ser, MD .  Documentation: I have reviewed the above documentation for accuracy and completeness, and I agree with the above.  Sarina Ser, MD

## 2019-09-03 ENCOUNTER — Encounter: Payer: Self-pay | Admitting: Dermatology

## 2019-09-21 ENCOUNTER — Other Ambulatory Visit: Payer: Self-pay | Admitting: Cardiovascular Disease

## 2019-09-21 MED ORDER — EZETIMIBE 10 MG PO TABS: 10.0000 mg | ORAL_TABLET | Freq: Every day | ORAL | 0 refills | Status: DC

## 2019-09-21 NOTE — Telephone Encounter (Signed)
*  STAT* If patient is at the pharmacy, call can be transferred to refill team.   1. Which medications need to be refilled? (please list name of each medication and dose if known) Zetia 10 mg daily  2. Which pharmacy/location (including street and city if local pharmacy) is medication to be sent to? [facility] on [address]  3. Do they need a 30 day or 90 day supply? [place]

## 2019-09-21 NOTE — Telephone Encounter (Signed)
Requested Prescriptions  ° °Signed Prescriptions Disp Refills  ° ezetimibe (ZETIA) 10 MG tablet 90 tablet 0  °  Sig: Take 1 tablet (10 mg total) by mouth daily.  °  Authorizing Provider: GOLLAN, TIMOTHY J  °  Ordering User: Kalon Erhardt C  ° ° °

## 2019-09-25 ENCOUNTER — Ambulatory Visit: Payer: Medicare Other | Admitting: Dermatology

## 2019-10-13 ENCOUNTER — Ambulatory Visit (INDEPENDENT_AMBULATORY_CARE_PROVIDER_SITE_OTHER): Payer: Medicare Other | Admitting: Cardiovascular Disease

## 2019-10-13 ENCOUNTER — Other Ambulatory Visit: Payer: Self-pay

## 2019-10-13 ENCOUNTER — Encounter: Payer: Self-pay | Admitting: Cardiovascular Disease

## 2019-10-13 VITALS — BP 132/64 | HR 81 | Ht 61.0 in | Wt 82.0 lb

## 2019-10-13 DIAGNOSIS — R0602 Shortness of breath: Secondary | ICD-10-CM | POA: Diagnosis not present

## 2019-10-13 DIAGNOSIS — Z951 Presence of aortocoronary bypass graft: Secondary | ICD-10-CM

## 2019-10-13 DIAGNOSIS — I10 Essential (primary) hypertension: Secondary | ICD-10-CM

## 2019-10-13 DIAGNOSIS — I25118 Atherosclerotic heart disease of native coronary artery with other forms of angina pectoris: Secondary | ICD-10-CM

## 2019-10-13 MED ORDER — EZETIMIBE 10 MG PO TABS: 10.0000 mg | ORAL_TABLET | Freq: Every day | ORAL | 3 refills | Status: DC

## 2019-10-13 MED ORDER — CLOPIDOGREL BISULFATE 75 MG PO TABS: 75.0000 mg | ORAL_TABLET | Freq: Every day | ORAL | 3 refills | Status: DC

## 2019-10-13 MED ORDER — ROSUVASTATIN CALCIUM 40 MG PO TABS: 40.0000 mg | ORAL_TABLET | Freq: Every day | ORAL | 3 refills | Status: DC

## 2019-10-13 MED ORDER — METOPROLOL TARTRATE 25 MG PO TABS: 25.0000 mg | ORAL_TABLET | Freq: Two times a day (BID) | ORAL | 3 refills | Status: DC

## 2019-10-13 MED ORDER — ISOSORBIDE MONONITRATE ER 30 MG PO TB24: 30.0000 mg | ORAL_TABLET | Freq: Every day | ORAL | 3 refills | Status: DC

## 2019-10-13 NOTE — Patient Instructions (Signed)

## 2019-10-13 NOTE — Progress Notes (Signed)
Cardiology Office Note  Date:  10/13/2019   ID:  Sally Miller, DOB 1930/11/28, MRN [id number]  PCP:  Derinda Late, MD   Chief Complaint  Patient presents with  . other    6 month follow up. Meds reviewed by the pt. verbally. "doing well."     HPI:  86 yo woman with PMH of Coronary artery disease- s/p CABG 1996 native right coronary artery was chronically occluded, collaterals.  patent LIMA to LAD, SVG to d, SVG to OM.  Hypertension Hyperlipidemia Syncope (dehydrated?) Chronic SOB Who presents for routine follow-up of her coronary artery disease  Last seen in clinic October 2020 Previously seen in the emergency room August 15, 2018 for chest pain Was hypertensive at the time, Cardiac work-up negative  Also with prior visit to the hospital February 2018 for chest pain in the setting of poorly controlled hypertension Previously declined cardiac catheterization, Also reported allergy to stress testing  In follow-up today reports that she is doing relatively well Weight continues to trend lower Active, no regular exercise program  Running out of her medications, would like refills for 90 days Wonders if she should take co-Q10 Denies significant myalgias or cramping Does her housework, ADLs  Lab work reviewed Total cholesterol 179, LDL 98 Tolerating Crestor and Zetia  EKG personally reviewed by myself on todays visit Shows normal sinus rhythm rate 81 bpm no significant ST-T wave changes  PMH:   has a past medical history of CAD (coronary artery disease), HTN (hypertension), Hyperlipemia, MI (myocardial infarction) ([place]), and Syncope.  PSH:    Past Surgical History:  Procedure Laterality Date  . CATARACT EXTRACTION, BILATERAL    . CORONARY ANGIOPLASTY    . CORONARY ARTERY BYPASS GRAFT  1996  . RENAL ARTERY STENT      Current Outpatient Medications  Medication Sig Dispense Refill  . ascorbic acid (VITAMIN C) 1000 MG tablet Take 1,000 mg by mouth daily.     Marland Kitchen  aspirin 81 MG tablet Take 1 tablet (81 mg total) by mouth daily. 30 tablet 6  . clopidogrel (PLAVIX) 75 MG tablet Take 1 tablet (75 mg total) by mouth daily. 90 tablet 3  . Cyanocobalamin (VITAMIN B 12) 100 MCG LOZG Take by mouth daily.    Marland Kitchen ezetimibe (ZETIA) 10 MG tablet Take 1 tablet (10 mg total) by mouth daily. 90 tablet 3  . feeding supplement, ENSURE ENLIVE, (ENSURE ENLIVE) LIQD Take 237 mLs by mouth 2 (two) times daily between meals. 237 mL 12  . fluticasone (FLONASE) 50 MCG/ACT nasal spray Place 1 spray into both nostrils daily.    . isosorbide mononitrate (IMDUR) 30 MG 24 hr tablet Take 1 tablet (30 mg total) by mouth daily. 90 tablet 3  . metoprolol tartrate (LOPRESSOR) 25 MG tablet Take 1 tablet (25 mg total) by mouth 2 (two) times daily. 180 tablet 3  . Multiple Vitamins-Minerals (MULTI ADULT GUMMIES PO) Take 1 tablet by mouth daily.    . Multiple Vitamins-Minerals (PRESERVISION AREDS PO) Take 1 capsule by mouth 2 (two) times daily.    . nitroGLYCERIN (NITROSTAT) 0.4 MG SL tablet Place 1 tablet (0.4 mg total) under the tongue every 5 (five) minutes as needed. 30 tablet 3  . Olopatadine HCl 0.2 % SOLN Place 1 drop into both eyes daily.     . rosuvastatin (CRESTOR) 40 MG tablet Take 1 tablet (40 mg total) by mouth daily. 90 tablet 3   No current facility-administered medications for this visit.  Allergies:   Other   Social History:  The patient  reports that she has never smoked. She has never used smokeless tobacco. She reports that she does not drink alcohol and does not use drugs.   Family History:   family history includes Coronary artery disease in her brother and sister; Heart attack in her brother and brother; Heart failure in her sister.    Review of Systems: Review of Systems  Constitutional: Negative.   Respiratory: Negative.   Cardiovascular: Negative.   Gastrointestinal: Negative.   Musculoskeletal: Negative.   Neurological: Negative.    Psychiatric/Behavioral: Negative.   All other systems reviewed and are negative.   PHYSICAL EXAM: VS:  BP (!) 132/64 (BP Location: Left Arm, Patient Position: Sitting, Cuff Size: Normal)   Pulse 81   Ht 5\' 1"  (1.549 m)   Wt (!) 82 lb (37.2 kg)   BMI 15.49 kg/m  , BMI Body mass index is 15.49 kg/m. Constitutional:  oriented to person, place, and time. No distress.  HENT:  Head: Grossly normal Eyes:  no discharge. No scleral icterus.  Neck: No JVD, no carotid bruits  Cardiovascular: Regular rate and rhythm, no murmurs appreciated Pulmonary/Chest: Clear to auscultation bilaterally, no wheezes or rails Abdominal: Soft.  no distension.  no tenderness.  Musculoskeletal: Normal range of motion Neurological:  normal muscle tone. Coordination normal. No atrophy Skin: Skin warm and dry Psychiatric: normal affect, pleasant  Recent Labs: No results found for requested labs within last 8760 hours.    Lipid Panel Lab Results  Component Value Date   CHOL 208 (H) 07/12/2014   HDL 76 07/12/2014   LDLCALC 115 (H) 07/12/2014   TRIG 86 07/12/2014    Wt Readings from Last 3 Encounters:  10/13/19 (!) 82 lb (37.2 kg)  01/09/19 84 lb (38.1 kg)  08/15/18 87 lb (39.5 kg)     ASSESSMENT AND PLAN:  Coronary artery disease of native artery of native heart with stable angina pectoris ([place]) Previous trips to the emergency room 2018, 2020 in the setting of high blood pressure Currently with no symptoms of angina. No further workup at this time. Continue current medication regimen.  Essential hypertension Blood pressure is well controlled on today's visit. No changes made to the medications.  Mixed hyperlipidemia On Crestor Zetia LDL slightly above goal Prior lab check was off her medications  Hx of CABG Previously declined any testing Currently denies any anginal symptoms concerning for angina  PAD Previous stenosis to the right renal artery on previous catheterization 2014 also  stent to the iliac vessel/artery  Previously declined ultrasound Stressed importance of aggressive cholesterol management    Total encounter time more than 25 minutes  Greater than 50% was spent in counseling and coordination of care with the patient   Orders Placed This Encounter  Procedures  . EKG 12-Lead     Signed, Esmond Plants, M.D., Ph.D. 10/13/2019  [place], [place]

## 2019-10-23 ENCOUNTER — Ambulatory Visit: Payer: Medicare Other | Admitting: Cardiovascular Disease

## 2019-11-13 ENCOUNTER — Ambulatory Visit: Payer: Medicare Other | Admitting: Dermatology

## 2019-11-13 ENCOUNTER — Ambulatory Visit (INDEPENDENT_AMBULATORY_CARE_PROVIDER_SITE_OTHER): Payer: Medicare Other | Admitting: Dermatology

## 2019-11-13 ENCOUNTER — Other Ambulatory Visit: Payer: Self-pay

## 2019-11-13 DIAGNOSIS — I25118 Atherosclerotic heart disease of native coronary artery with other forms of angina pectoris: Secondary | ICD-10-CM | POA: Diagnosis not present

## 2019-11-13 DIAGNOSIS — T1490XA Injury, unspecified, initial encounter: Secondary | ICD-10-CM | POA: Diagnosis not present

## 2019-11-13 DIAGNOSIS — L570 Actinic keratosis: Secondary | ICD-10-CM

## 2019-11-13 DIAGNOSIS — L578 Other skin changes due to chronic exposure to nonionizing radiation: Secondary | ICD-10-CM

## 2019-11-13 NOTE — Progress Notes (Signed)
   Follow-Up Visit   Subjective  Sally Miller is a 86 y.o. female who presents for the following: Follow-up (Patient here today for 2 month AK/ISK follow up. ). Patient here to recheck AK's treated at face with LN2 x 10 and ISK's treated at left calf and right antecubital. She would also like left great toenail checked. She dropped a suitcase on it about 3 weeks ago and the nail came off last week. She just wants to make sure it doesn't look like it's getting infected. She has used neosporin on it.  The following portions of the chart were reviewed this encounter and updated as appropriate:  Tobacco  Allergies  Meds  Problems  Med Hx  Surg Hx  Fam Hx     Review of Systems:  No other skin or systemic complaints except as noted in HPI or Assessment and Plan.  Objective  Well appearing patient in no apparent distress; mood and affect are within normal limits.  A focused examination was performed including face, feet, lower legs, arms. Relevant physical exam findings are noted in the Assessment and Plan.  Objective  R forehead x 1, R nasal ala x 1, R cheek x 2, R mid dorsum nose x 1, L nose x 2, L cheek x 2 (9): Erythematous thin papules/macules with gritty scale.   Objective  Left great toe: Loss of toenail with crust remaining   Assessment & Plan  AK (actinic keratosis) (9) R forehead x 1, R nasal ala x 1, R cheek x 2, R mid dorsum nose x 1, L nose x 2, L cheek x 2  Destruction of lesion - R forehead x 1, R nasal ala x 1, R cheek x 2, R mid dorsum nose x 1, L nose x 2, L cheek x 2 Complexity: simple   Destruction method: cryotherapy   Informed consent: discussed and consent obtained   Timeout:  patient name, date of birth, surgical site, and procedure verified Lesion destroyed using liquid nitrogen: Yes   Region frozen until ice ball extended beyond lesion: Yes   Outcome: patient tolerated procedure well with no complications   Post-procedure details: wound care  instructions given    Traumatic injury Left great toe  2ndary to trauma Patient dropped a suitcase on it about 3 weeks ago and the nail came off 4 days ago.  Patient advised it can take a year for the nail to grow back out.  Actinic Damage - diffuse scaly erythematous macules with underlying dyspigmentation - Recommend daily broad spectrum sunscreen SPF 30+ to sun-exposed areas, reapply every 2 hours as needed.  - Call for new or changing lesions.   Return in about 6 months (around 05/13/2020) for AK follow up.  Graciella Belton, RMA, am acting as scribe for Sarina Ser, MD . Documentation: I have reviewed the above documentation for accuracy and completeness, and I agree with the above.  Sarina Ser, MD

## 2019-11-13 NOTE — Patient Instructions (Signed)
Cryotherapy Aftercare  . Wash gently with soap and water everyday.   . Apply Vaseline and Band-Aid daily until healed.  

## 2019-11-18 ENCOUNTER — Encounter: Payer: Self-pay | Admitting: Dermatology

## 2020-02-22 NOTE — Telephone Encounter (Signed)
Consent

## 2020-07-25 ENCOUNTER — Other Ambulatory Visit: Payer: Self-pay

## 2020-07-25 ENCOUNTER — Observation Stay
Admission: EM | Admit: 2020-07-25 | Discharge: 2020-07-26 | Disposition: A | Discharge status: Home / Self Care | Payer: Medicare Other | Attending: Internal Medicine | Admitting: Internal Medicine

## 2020-07-25 ENCOUNTER — Encounter: Payer: Self-pay | Admitting: *Deleted

## 2020-07-25 DIAGNOSIS — N178 Other acute kidney failure: Secondary | ICD-10-CM | POA: Diagnosis not present

## 2020-07-25 DIAGNOSIS — Z7982 Long term (current) use of aspirin: Secondary | ICD-10-CM | POA: Insufficient documentation

## 2020-07-25 DIAGNOSIS — D62 Acute posthemorrhagic anemia: Secondary | ICD-10-CM | POA: Diagnosis not present

## 2020-07-25 DIAGNOSIS — Z20822 Contact with and (suspected) exposure to covid-19: Secondary | ICD-10-CM | POA: Diagnosis not present

## 2020-07-25 DIAGNOSIS — Z951 Presence of aortocoronary bypass graft: Secondary | ICD-10-CM | POA: Diagnosis not present

## 2020-07-25 DIAGNOSIS — K922 Gastrointestinal hemorrhage, unspecified: Secondary | ICD-10-CM | POA: Diagnosis not present

## 2020-07-25 DIAGNOSIS — R112 Nausea with vomiting, unspecified: Secondary | ICD-10-CM

## 2020-07-25 DIAGNOSIS — K920 Hematemesis: Secondary | ICD-10-CM

## 2020-07-25 DIAGNOSIS — I25118 Atherosclerotic heart disease of native coronary artery with other forms of angina pectoris: Secondary | ICD-10-CM | POA: Insufficient documentation

## 2020-07-25 DIAGNOSIS — Z79899 Other long term (current) drug therapy: Secondary | ICD-10-CM | POA: Diagnosis not present

## 2020-07-25 DIAGNOSIS — I10 Essential (primary) hypertension: Secondary | ICD-10-CM | POA: Insufficient documentation

## 2020-07-25 DIAGNOSIS — E782 Mixed hyperlipidemia: Secondary | ICD-10-CM

## 2020-07-25 DIAGNOSIS — N19 Unspecified kidney failure: Secondary | ICD-10-CM | POA: Diagnosis present

## 2020-07-25 LAB — COMPREHENSIVE METABOLIC PANEL
ALT: 38 U/L (ref 0–44)
AST: 44 U/L — ABNORMAL HIGH (ref 15–41)
Albumin: 3.5 g/dL (ref 3.5–5.0)
Alkaline Phosphatase: 51 U/L (ref 38–126)
Anion gap: 7 (ref 5–15)
BUN: 34 mg/dL — ABNORMAL HIGH (ref 8–23)
CO2: 27 mmol/L (ref 22–32)
Calcium: 9.1 mg/dL (ref 8.9–10.3)
Chloride: 104 mmol/L (ref 98–111)
Creatinine, Ser: 1.02 mg/dL — ABNORMAL HIGH (ref 0.44–1.00)
GFR, Estimated: 53 mL/min — ABNORMAL LOW (ref >60)
Glucose, Bld: 154 mg/dL — ABNORMAL HIGH (ref 70–99)
Potassium: 3.6 mmol/L (ref 3.5–5.1)
Sodium: 138 mmol/L (ref 135–145)
Total Bilirubin: 0.9 mg/dL (ref 0.3–1.2)
Total Protein: 6.3 g/dL — ABNORMAL LOW (ref 6.5–8.1)

## 2020-07-25 LAB — CBC
HCT: 34.2 % — ABNORMAL LOW (ref 36.0–46.0)
Hemoglobin: 11 g/dL — ABNORMAL LOW (ref 12.0–15.0)
MCH: 30.9 pg (ref 26.0–34.0)
MCHC: 32.2 g/dL (ref 30.0–36.0)
MCV: 96.1 fL (ref 80.0–100.0)
Platelets: 165 10*3/uL (ref 150–400)
RBC: 3.56 MIL/uL — ABNORMAL LOW (ref 3.87–5.11)
RDW: 13.1 % (ref 11.5–15.5)
WBC: 10.6 10*3/uL — ABNORMAL HIGH (ref 4.0–10.5)
nRBC: 0 % (ref 0.0–0.2)

## 2020-07-25 LAB — HEMOGLOBIN AND HEMATOCRIT, BLOOD
HCT: 31.6 % — ABNORMAL LOW (ref 36.0–46.0)
Hemoglobin: 10.2 g/dL — ABNORMAL LOW (ref 12.0–15.0)

## 2020-07-25 LAB — RESP PANEL BY RT-PCR (FLU A&B, COVID) ARPGX2
Influenza A by PCR: NEGATIVE
Influenza B by PCR: NEGATIVE
SARS Coronavirus 2 by RT PCR: NEGATIVE

## 2020-07-25 LAB — LIPASE, BLOOD: Lipase: 42 U/L (ref 11–51)

## 2020-07-25 LAB — ABO/RH: ABO/RH(D): O POS

## 2020-07-25 MED ORDER — ONDANSETRON HCL 4 MG/2ML IJ SOLN: 4.0000 mg | Freq: Four times a day (QID) | INTRAMUSCULAR | Status: DC | PRN

## 2020-07-25 MED ORDER — SODIUM CHLORIDE 0.9 % IV SOLN
8.0000 mg/h | INTRAVENOUS | Status: DC
  Administered 2020-07-25: 8 mg/h via INTRAVENOUS
  Filled 2020-07-25 (×2): qty 80

## 2020-07-25 MED ORDER — SODIUM CHLORIDE 0.9 % IV SOLN: 10.0000 mL/h | Freq: Once | INTRAVENOUS | Status: DC

## 2020-07-25 MED ORDER — PANTOPRAZOLE SODIUM 40 MG IV SOLR
40.0000 mg | Freq: Once | INTRAVENOUS | Status: AC
  Administered 2020-07-25: 40 mg via INTRAVENOUS
  Filled 2020-07-25: qty 40

## 2020-07-25 MED ORDER — SODIUM CHLORIDE 0.9 % IV SOLN: INTRAVENOUS | Status: DC

## 2020-07-25 MED ORDER — LACTATED RINGERS IV BOLUS
500.0000 mL | Freq: Once | INTRAVENOUS | Status: AC
  Administered 2020-07-25: 500 mL via INTRAVENOUS

## 2020-07-25 NOTE — H&P (Signed)
History and Physical    PLEASE NOTE THAT DRAGON DICTATION SOFTWARE WAS USED IN THE CONSTRUCTION OF THIS NOTE.   Sally Miller [id number] DOB: 1930-06-18 DOA: 07/25/2020  PCP: Derinda Late, MD Patient coming from: home   I have personally briefly reviewed patient's old medical records in [place]  Chief Complaint: Hematemesis  HPI: Sally Miller is a 87 y.o. female with medical history significant for coronary artery disease status post three-vessel CABG in April 2014, hypertension, hyperlipidemia, acute upper gastrointestinal bleed in the 1980s, who is admitted to [facility] on 07/25/2020 with acute upper GI bleed after presenting from home to [facility] ED complaining of hematemesis.   Of note, the following history is provided via my discussions with the patient as well as my discussions with the patient's daughter, who is present at bedside, in addition to my discussions with the emergency department physician and via chart review.  The patient reports that she has experienced a total of 3 episodes of hematemesis over the last day, with first such episode occurring at approximately 1500 today.  She notes most recent episode of hematemesis occurred in the emergency department this evening, following which she reports no residual nausea at this time.  She describes the emesis with these 3 episodes as being associated with a small amount of bright red blood as well as the appearance of blood clots, with each of the 3 episodes noted to be associated with progressive decrease in the volume of emesis/clots.  Denies any associated abdominal pain or any recent trauma.  Denies any recent diarrhea, melena, or hematochezia.  Aside from these 3 episodes of nausea/vomiting, the patient reports that she has otherwise been asymptomatic, and specifically denies any recent chest pain, shortness of breath, palpitations, diaphoresis, dizziness, presyncope, or syncope.  Denies any  associated subjective fever, chills, rigors, generalized myalgias, or rash.  She notes that she has been under a very high level of stress acutely over the last 7 to 10 days following the death of her niece, prompting her to travel to [place] for the funeral.  She denies any ensuing hemoptysis, cough, calf tenderness, lower extremity edema, or acute lower extremity erythema.  She conveys a prior history of acute upper gastrointestinal bleed in the 1980s, when she presented with symptoms very similar to that with which she presents this evening.  She reports that she underwent medical management at that time, and that her acute upper gastrointestinal bleed spontaneously resolved without any endoscopic evaluation.  She specifically reports that she has never previously undergone any EGD or colonoscopy.  Denies any acute gastrointestinal bleed following the single aforementioned episode in the 1980s until her presentation this evening.  She denies any recent or regular consumption of alcohol, and denies any known history of underlying liver disease.  She knowledges a history of coronary artery disease, with three-vessel CABG in April 2014.  She notes that her most recent coronary angiography occurred in 2014 at [facility].  In the setting of his history of coronary artery disease, patient reports that she is on chronic dual antiplatelet therapy with Plavix and baby aspirin.  She notes that her most recent doses of both of these blood thinners occurred on the evening of 07/24/2020.  Otherwise, she denies any use of additional blood thinners at home.  Denies any recent use of NSAIDs.  She also denies taking any of the following oral medications at home: Systemic corticosteroids, supplemental potassium, supplemental iron, or bisphosphonate medications.  Denies any personal  history of malignancy and denies any recent unintentional weight loss, dysphagia, or early satiety.   Per chart review, it appears that patient's  baseline hemoglobin is 12.5-14, with most recent prior hemoglobin noted to be 14 in June 2020.  No known history of prior blood transfusion.   No known recent COVID-19 exposures.      ED Course:  Vital signs in the ED were notable for the following: Temperature max 98.6, heart rate 86-1 06, blood pressure 135/85; respiratory rate 16, oxygen saturation 99% on room air.  Labs were notable for the following: CMP was notable for the following: Sodium 138, BUN 34 relative to most recent prior value of 18 in June 2020, creatinine 1.02 relative to baseline range of 0.8-1.1, with most recent prior serum creatinine data point noted to be 0.95 in June 2020, while liver enzymes were found to be within normal limits.  CBC notable for white blood cell count of 10,600, hemoglobin 11 compared to baseline range of 2.5-14, as further noted above, with presenting hemoglobin associated with normocytic/normochromic findings as well as a nonelevated RDW, platelets 165.  Type and screen was performed in the ED this evening.  Screening nasopharyngeal COVID-19 PCR was performed in the ED this evening, with result currently pending.  EKG showed sinus tachycardia with heart rate 106, normal intervals, nonspecific T wave inversion in the inferior leads as well as V4 through V6, relative to evidence of T wave flattening in these leads on most recent prior EKG from July 2021, while showing no evidence of ST changes, including no evidence of ST elevation.  The patient's case was discussed with the on-call gastroenterologist, Dr. Vicente Males, who will formally consult. Dr. Vicente Males does not feel that there currently exists an indication for overnight EGD, rather recommends close monitoring of ensuing hemoglobin, n.p.o., and plans to evaluate the patient in the morning.  While in the ED, the following were administered: Protonix 40 mg IV x1, lactated Ringer's x500 cc bolus.  Socially, the patient was admitted to the PCU for further  evaluation management of presenting acute upper gastrointestinal bleed.     Review of Systems: As per HPI otherwise 10 point review of systems negative.   Past Medical History:  Diagnosis Date  . CAD (coronary artery disease)    a. S/P prior LAD/LCX stenting;  b. S/P prior CABG x 3; c. 06/2012 Cath: LM 20, LAD 100p, 57m ISR, LCX 20p ISR, 34m ISR, RCA 100 CTO, patent LIMA to LAD, SVG to diag2, SVG to OM2.   Marland Kitchen HTN (hypertension)   . Hyperlipemia   . MI (myocardial infarction) (August)   . Syncope    a. 06/2012.    Past Surgical History:  Procedure Laterality Date  . CATARACT EXTRACTION, BILATERAL    . CORONARY ANGIOPLASTY    . CORONARY ARTERY BYPASS GRAFT  1996  . RENAL ARTERY STENT      Social History:  reports that she has never smoked. She has never used smokeless tobacco. She reports that she does not drink alcohol and does not use drugs.   Allergies  Allergen Reactions  . Other     Dye used for chemical stress test.     Family History  Problem Relation Age of Onset  . Heart attack Brother   . Heart attack Brother   . Heart failure Sister   . Coronary artery disease Sister   . Coronary artery disease Brother        had cabg  Prior to Admission medications   Medication Sig Start Date End Date Taking? Authorizing Provider  ascorbic acid (VITAMIN C) 1000 MG tablet Take 1,000 mg by mouth daily.     [provider]  aspirin 81 MG tablet Take 1 tablet (81 mg total) by mouth daily. 12/26/12   Nahser, Wonda Cheng, MD  clopidogrel (PLAVIX) 75 MG tablet Take 1 tablet (75 mg total) by mouth daily. 10/13/19   Minna Merritts, MD  Cyanocobalamin (VITAMIN B 12) 100 MCG LOZG Take by mouth daily.    [provider]  ezetimibe (ZETIA) 10 MG tablet Take 1 tablet (10 mg total) by mouth daily. 10/13/19   Minna Merritts, MD  feeding supplement, ENSURE ENLIVE, (ENSURE ENLIVE) LIQD Take 237 mLs by mouth 2 (two) times daily between meals. 04/24/16   Gladstone Lighter,  MD  fluticasone (FLONASE) 50 MCG/ACT nasal spray Place 1 spray into both nostrils daily. 01/21/16   [provider]  isosorbide mononitrate (IMDUR) 30 MG 24 hr tablet Take 1 tablet (30 mg total) by mouth daily. 10/13/19   Minna Merritts, MD  metoprolol tartrate (LOPRESSOR) 25 MG tablet Take 1 tablet (25 mg total) by mouth 2 (two) times daily. 10/13/19   Minna Merritts, MD  Multiple Vitamins-Minerals (MULTI ADULT GUMMIES PO) Take 1 tablet by mouth daily.    [provider]  Multiple Vitamins-Minerals (PRESERVISION AREDS PO) Take 1 capsule by mouth 2 (two) times daily.    [provider]  nitroGLYCERIN (NITROSTAT) 0.4 MG SL tablet Place 1 tablet (0.4 mg total) under the tongue every 5 (five) minutes as needed. 04/24/16   Gladstone Lighter, MD  Olopatadine HCl 0.2 % SOLN Place 1 drop into both eyes daily.  04/12/16   [provider]  rosuvastatin (CRESTOR) 40 MG tablet Take 1 tablet (40 mg total) by mouth daily. 10/13/19   Minna Merritts, MD     Objective    Physical Exam: Vitals:   07/25/20 1852 07/25/20 1908  BP:  135/85  Pulse:  86  Resp:  16  Temp:  98.6 F (37 C)  TempSrc:  Oral  SpO2:  99%  Weight: 37.2 kg   Height: 5\' 1"  (1.549 m)     General: appears to be stated age; alert, oriented Skin: warm, dry, no rash Head:  AT/[place] Mouth:  Oral mucosa membranes appear dry, normal dentition Neck: supple; trachea midline Heart:  RRR; did not appreciate any M/R/G Lungs: CTAB, did not appreciate any wheezes, rales, or rhonchi Abdomen: + BS; soft, ND, NT Vascular: 2+ pedal pulses b/l; 2+ radial pulses b/l Extremities: no peripheral edema, no muscle wasting Neuro: strength and sensation intact in upper and lower extremities b/l    Labs on Admission: I have personally reviewed following labs and imaging studies  CBC: Recent Labs  Lab 07/25/20 1859  WBC 10.6*  HGB 11.0*  HCT 34.2*  MCV 96.1  PLT 409   Basic Metabolic Panel: Recent Labs   Lab 07/25/20 1859  NA 138  K 3.6  CL 104  CO2 27  GLUCOSE 154*  BUN 34*  CREATININE 1.02*  CALCIUM 9.1   GFR: Estimated Creatinine Clearance: 22.8 mL/min (A) (by C-G formula based on SCr of 1.02 mg/dL (H)). Liver Function Tests: Recent Labs  Lab 07/25/20 1859  AST 44*  ALT 38  ALKPHOS 51  BILITOT 0.9  PROT 6.3*  ALBUMIN 3.5   Recent Labs  Lab 07/25/20 1859  LIPASE 42   No results  for input(s): AMMONIA in the last 168 hours. Coagulation Profile: No results for input(s): INR, PROTIME in the last 168 hours. Cardiac Enzymes: No results for input(s): CKTOTAL, CKMB, CKMBINDEX, TROPONINI in the last 168 hours. BNP (last 3 results) No results for input(s): PROBNP in the last 8760 hours. HbA1C: No results for input(s): HGBA1C in the last 72 hours. CBG: No results for input(s): GLUCAP in the last 168 hours. Lipid Profile: No results for input(s): CHOL, HDL, LDLCALC, TRIG, CHOLHDL, LDLDIRECT in the last 72 hours. Thyroid Function Tests: No results for input(s): TSH, T4TOTAL, FREET4, T3FREE, THYROIDAB in the last 72 hours. Anemia Panel: No results for input(s): VITAMINB12, FOLATE, FERRITIN, TIBC, IRON, RETICCTPCT in the last 72 hours. Urine analysis: No results found for: COLORURINE, APPEARANCEUR, LABSPEC, PHURINE, GLUCOSEU, HGBUR, BILIRUBINUR, KETONESUR, PROTEINUR, UROBILINOGEN, NITRITE, LEUKOCYTESUR  Radiological Exams on Admission: No results found.   EKG: Independently reviewed, with result as described above.    Assessment/Plan   Sally Miller is a 87 y.o. female with medical history significant for coronary artery disease status post three-vessel CABG in April 2014, hypertension, hyperlipidemia, acute upper gastrointestinal bleed in the 1980s, who is admitted to [facility] on 07/25/2020 with acute upper GI bleed after presenting from home to [facility] ED complaining of hematemesis.    Principal Problem:   Acute upper GI bleed Active  Problems:   Essential hypertension   Mixed hyperlipidemia   Acute blood loss anemia   Nausea & vomiting   Acute prerenal azotemia      #) Acute Upper GI Bleed: diagnosis on the basis of 3 episodes of hematemesis over the last day, with elevated BUN.  On a daily baby aspirin as well as Plavix as an outpatient, with most recent doses of both of these medications occurring on the evening of 07/24/2020.  Denies any NSAID use or known history of underlying liver disease.  Denies any history of alcohol abuse or recent alcohol consumption.  Per the patient, she has never previously undergone any endoscopic evaluation, neither EGD, nor colonoscopy.  In the absence of known liver disease, initiation of SBP prophylaxis does not appear to be warranted.  Presentation and history are less suggestive of variceal bleed, and therefore there does not appear to be an indication for octreotide.  Probably, differential for presenting acute upper gastrointestinal bleed includes, but is not limited to erosive esophagitis, gastritis, peptic ulcer disease Guilfoyle lesion, AVM.   Mild tachycardia with heart rates in the low 100s are noted, with normotensive blood pressures.  Of note, this is in the setting of taking a beta-blocker at home. Presentation appears to be associated with acute blood loss anemia, as further described below, with presenting hemoglobin noted to be 11.  Currently asymptomatic, last episode of hematemesis occurred upon arrival to the ED this evening. Of note, patient was typed and screened in the ED today. Given suspected upper GI source, will initiate Protonix drip. Case was discussed with the on-call gastroenterologist, Dr. Vicente Males, who will formally consult. Dr. Vicente Males does not feel that there currently exists an indication for overnight EGD, rather recommends close monitoring of ensuing hemoglobin, n.p.o., and plans to evaluate the patient in the morning.  Stat H&H has been ordered, with ensuing plan for  close monitoring of her Hgb trend with correlation to vital sign trends as well as any additional episodes of hematemesis in order to guide need for ensuing PRBC transfusion.      Plan: NPO. Refraining from pharmacologic DVT prophylaxis. Monitor  on telemetry. Monitor continuous pulse-ox. Maintain at least 2 large bore IV's.   Add on INR  And recheck in the AM. STAT H&H, with ensuing Q4H H&H's have been ordered through 9 AM tomorrow (07/26/20). Will closely monitor these ensuing Hgb levels and correlate these data points with the patient's overall clinical picture including vital signs to determine need for subsequent transfusion.  Protonix drip, as above.  Gentle IV fluids in the form of normal saline at 50 cc/hr with associated rationale for such from the standpoint of concomitant presenting acute prerenal azotemia, as further noted below.  Hold home aspirin and Plavix for now.  Repeat CMP in the morning, including for monitoring interval trend in BUN level.  Gastroenterology has been formally consulted, as further described above.  Additional evaluation and management of presenting acute blood loss anemia, as further detailed below.       #) Acute blood loss anemia: in the setting of presenting acute upper GI bleed, presenting Hgb noted to be 11 relative to baseline range of 12.5-14, including most recent prior hemoglobin of 14 in June 2020.  Of note, presenting hemoglobin is noted to be normocytic/normochromic in nature, and associated with a nonelevated RDW.  Close monitoring of the patient's hemodynamic status and serial hemoglobin trend, as above.  Type and screen performed in the ED this evening, as described above.   Plan: work-up and management for presenting suspected acute upper GI bleed, as above, including close monitoring of Q4H H&H's, with clinical evaluation for determination of need for blood transfusion, as further described above. Monitor on telemetry. Monitor continuous pulse-ox. NPO.  Refraining from pharmacologic DVT prophylaxis.  Add on INR and recheck this value in the morning. add-on the following to pre-transfusion labs collected in the ED today: total iron, TIBC, ferritin, MMA, folic acid level, reticulocyte count.  Holding home dual antiplatelet therapy for now.  Gastroenterology formally consulted, as further described above.      #) Acute prerenal azotemia: Presenting labs reflect BUN to creatinine ratio of approximately 34, with at least a partial contribution to elevated BUN stemming from presenting acute upper gastrointestinal bleed.  Additionally, the patient appears to be dehydrated given acute gastrointestinal losses in the setting of presenting nausea/vomiting, raising possibility of concomitant contribution to acute prerenal azotemia from an element of intravascular depletion in setting of dehydration.  Of note, renal function appears to be within the patient's baseline creatinine ranges 0.8-1.1.   Plan: Monitor strict I's and O's and daily weights.  Attempt avoid nephrotoxic agents.  Work-up and management of presenting acute upper gastrointestinal bleed and associated acute blood loss anemia, as further detailed above.  Gentle IV fluids in the form of normal saline at 50 cc/h.  Repeat BMP in the morning.       #) non-specific T-wave inversion: Presenting EKG shows sinus tachycardia with heart rate 106, normal intervals, and nonspecific T wave inversion in the inferior leads as well as V4 through V6 relative to most recent prior EKG from July 2021 showing evidence of T wave flattening in these leads, while presenting EKG shows no evidence of ST changes, including no evidence of ST elevation.  Significance of these nonspecific changes are currently not completely clear. No STEMI , as noted above. Of note, aside from her presenting 3 episodes of nausea/vomiting with associated hematemesis, she reports that she has otherwise been asymptomatic, including no recent  chest pain, shortness of breath, palpitations, diaphoresis, dizziness, recent B, or syncope, and denies any recent trauma.  It is possible that this nonspecific T wave inversion relative to prior T wave flattening may be on the basis of supply demand mismatch in the setting of acute blood loss anemia, although, clinically, a type 1 NSTEMI appears much less likely at this time. Will add-on Troponin with 4 hour repeat value also ordered, and repeat EKG in the morning to further trend these nonspecific findings, while engaging in evaluation and management of presenting acute upper gastrointestinal bleed and associated acute blood loss anemia in the interval.   Plan: Monitor on symmetry.  Monitor on continuous pulse oximetry.  Add on high-sensitivity troponin I, with repeat value ordered for 4 hours from that time.  Repeat EKG ordered for the morning, as above.  Add on serum magnesium level.  Further evaluation management of acute upper gastrointestinal bleed and resultant acute blood loss anemia, as further detailed above, including close monitoring of serial H&H values as well as trend and vital signs.       #) Essential hypertension: Outpatient hypertensive regimen includes Imdur as well as Lopressor.  Systolic blood pressures have been in the 130s in the ED thus far, without evidence of hypotension.  Plan: In the setting of presenting acute upper gastrointestinal bleed and associated current n.p.o. status, will hold home antihypertensive medications for now.  Close monitoring of ensuing blood pressure via routine vital signs.      #) Hyperlipidemia: On rosuvastatin and Zetia as an outpatient.  Plan: In the setting of current n.p.o. status, hold home statin as well as Zetia for now.    DVT prophylaxis: scd's  Code Status: Full code Family Communication: The patient's case was discussed with her daughter, who was present at bedside. Disposition Plan: Per Rounding Team Consults called: Case  was discussed with the on-call gastroenterologist, Dr. Vicente Males, as further detailed above. Admission status: Observation; PCU     Of note, this patient was added by me to the following Admit List/Treatment Team: armcadmits.      PLEASE NOTE THAT DRAGON DICTATION SOFTWARE WAS USED IN THE CONSTRUCTION OF THIS NOTE.   [facility] Pager [phone] From [place]  Otherwise, please contact night-coverage  [email or web address] Password [facility]   07/25/2020, 8:58 PM

## 2020-07-25 NOTE — ED Triage Notes (Signed)
Reporting hematemesis starting today with "blood clots" in her vomit. Pt reports hx of bleeding stomach ulcer. Pt reports a near syncopal episode before the second episode of vomiting.

## 2020-07-25 NOTE — ED Provider Notes (Signed)
[facility] Emergency Department Provider Note   ____________________________________________   Event Date/Time   First MD Initiated Contact with Patient 07/25/20 1940     (approximate)  I have reviewed the triage vital signs and the nursing notes.   HISTORY  Chief Complaint Hematemesis    HPI Sally Miller is a 87 y.o. female with past medical history of hypertension, hyperlipidemia, and CAD status post CABG who presents to the ED complaining of hematemesis.  Patient reports that this afternoon she started feeling nauseous and vomited up "blood clots."  This has happened once more since she has arrived at the hospital, but she now states she feels better, denies any ongoing nausea or abdominal pain.  She takes Plavix, but denies any NSAID or steroid use, does not drink alcohol.  She states she had similar symptoms about 20 years ago but has never had an endoscopy.  She has not noticed any blood in her stool recently.        Past Medical History:  Diagnosis Date  . CAD (coronary artery disease)    a. S/P prior LAD/LCX stenting;  b. S/P prior CABG x 3; c. 06/2012 Cath: LM 20, LAD 100p, 31m ISR, LCX 20p ISR, 60m ISR, RCA 100 CTO, patent LIMA to LAD, SVG to diag2, SVG to OM2.   Marland Kitchen HTN (hypertension)   . Hyperlipemia   . MI (myocardial infarction) ([place])   . Syncope    a. 06/2012.    Patient Active Problem List   Diagnosis Date Noted  . SOB (shortness of breath) 05/07/2017  . Hx of CABG   . Elevated troponin   . Chest pain 04/22/2016  . Coronary artery disease of native artery of native heart with stable angina pectoris ([place]) 06/23/2010  . Essential hypertension 06/23/2010  . Mixed hyperlipidemia 06/23/2010  . Fatigue 06/23/2010    Past Surgical History:  Procedure Laterality Date  . CATARACT EXTRACTION, BILATERAL    . CORONARY ANGIOPLASTY    . CORONARY ARTERY BYPASS GRAFT  1996  . RENAL ARTERY STENT      Prior to Admission medications    Medication Sig Start Date End Date Taking? Authorizing Provider  ascorbic acid (VITAMIN C) 1000 MG tablet Take 1,000 mg by mouth daily.     [provider]  aspirin 81 MG tablet Take 1 tablet (81 mg total) by mouth daily. 12/26/12   Nahser, Wonda Cheng, MD  clopidogrel (PLAVIX) 75 MG tablet Take 1 tablet (75 mg total) by mouth daily. 10/13/19   Minna Merritts, MD  Cyanocobalamin (VITAMIN B 12) 100 MCG LOZG Take by mouth daily.    [provider]  ezetimibe (ZETIA) 10 MG tablet Take 1 tablet (10 mg total) by mouth daily. 10/13/19   Minna Merritts, MD  feeding supplement, ENSURE ENLIVE, (ENSURE ENLIVE) LIQD Take 237 mLs by mouth 2 (two) times daily between meals. 04/24/16   Gladstone Lighter, MD  fluticasone (FLONASE) 50 MCG/ACT nasal spray Place 1 spray into both nostrils daily. 01/21/16   [provider]  isosorbide mononitrate (IMDUR) 30 MG 24 hr tablet Take 1 tablet (30 mg total) by mouth daily. 10/13/19   Minna Merritts, MD  metoprolol tartrate (LOPRESSOR) 25 MG tablet Take 1 tablet (25 mg total) by mouth 2 (two) times daily. 10/13/19   Minna Merritts, MD  Multiple Vitamins-Minerals (MULTI ADULT GUMMIES PO) Take 1 tablet by mouth daily.    [provider]  Multiple Vitamins-Minerals (PRESERVISION AREDS PO)  Take 1 capsule by mouth 2 (two) times daily.    [provider]  nitroGLYCERIN (NITROSTAT) 0.4 MG SL tablet Place 1 tablet (0.4 mg total) under the tongue every 5 (five) minutes as needed. 04/24/16   Gladstone Lighter, MD  Olopatadine HCl 0.2 % SOLN Place 1 drop into both eyes daily.  04/12/16   [provider]  rosuvastatin (CRESTOR) 40 MG tablet Take 1 tablet (40 mg total) by mouth daily. 10/13/19   Minna Merritts, MD    Allergies Other  Family History  Problem Relation Age of Onset  . Heart attack Brother   . Heart attack Brother   . Heart failure Sister   . Coronary artery disease Sister   . Coronary artery disease Brother         had cabg    Social History Social History   Tobacco Use  . Smoking status: Never Smoker  . Smokeless tobacco: Never Used  Substance Use Topics  . Alcohol use: No  . Drug use: No    Review of Systems  Constitutional: No fever/chills Eyes: No visual changes. ENT: No sore throat. Cardiovascular: Denies chest pain. Respiratory: Denies shortness of breath. Gastrointestinal: No abdominal pain.  Positive for nausea and hematemesis.  No diarrhea.  No constipation. Genitourinary: Negative for dysuria. Musculoskeletal: Negative for back pain. Skin: Negative for rash. Neurological: Negative for headaches, focal weakness or numbness.  ____________________________________________   PHYSICAL EXAM:  VITAL SIGNS: ED Triage Vitals  Enc Vitals Group     BP 07/25/20 1908 135/85     Pulse Rate 07/25/20 1908 86     Resp 07/25/20 1908 16     Temp 07/25/20 1908 98.6 F (37 C)     Temp Source 07/25/20 1908 Oral     SpO2 07/25/20 1908 99 %     Weight 07/25/20 1852 82 lb 0.2 oz (37.2 kg)     Height 07/25/20 1852 5\' 1"  (1.549 m)     Head Circumference --      Peak Flow --      Pain Score 07/25/20 1852 0     Pain Loc --      Pain Edu? --      Excl. in [place]? --     Constitutional: Alert and oriented. Eyes: Conjunctivae are normal. Head: Atraumatic. Nose: No congestion/rhinnorhea. Mouth/Throat: Mucous membranes are moist.  Dried blood around lips. Neck: Normal ROM Cardiovascular: Normal rate, regular rhythm. Grossly normal heart sounds. Respiratory: Normal respiratory effort.  No retractions. Lungs CTAB. Gastrointestinal: Soft and nontender. No distention. Genitourinary: deferred Musculoskeletal: No lower extremity tenderness nor edema. Neurologic:  Normal speech and language. No gross focal neurologic deficits are appreciated. Skin:  Skin is warm, dry and intact. No rash noted. Psychiatric: Mood and affect are normal. Speech and behavior are  normal.  ____________________________________________   LABS (all labs ordered are listed, but only abnormal results are displayed)  Labs Reviewed  CBC - Abnormal; Notable for the following components:      Result Value   WBC 10.6 (*)    RBC 3.56 (*)    Hemoglobin 11.0 (*)    HCT 34.2 (*)    All other components within normal limits  COMPREHENSIVE METABOLIC PANEL - Abnormal; Notable for the following components:   Glucose, Bld 154 (*)    BUN 34 (*)    Creatinine, Ser 1.02 (*)    Total Protein 6.3 (*)    AST 44 (*)    GFR, Estimated 53 (*)  All other components within normal limits  RESP PANEL BY RT-PCR (FLU A&B, COVID) ARPGX2  LIPASE, BLOOD  TYPE AND SCREEN  PREPARE RBC (CROSSMATCH)   ____________________________________________  EKG  ED ECG REPORT I, Blake Divine, the attending physician, personally viewed and interpreted this ECG.   Date: 07/25/2020  EKG Time: 18:56  Rate: 106  Rhythm: sinus tachycardia  Axis: Normal  Intervals:none  ST&T Change: Nonspecific ST/T changes   PROCEDURES  Procedure(s) performed (including Critical Care):  Procedures   ____________________________________________   INITIAL IMPRESSION / ASSESSMENT AND PLAN / ED COURSE       87 year old female with past medical history of hypertension, hyperlipidemia, and CAD status post CABG who presents to the ED with hematemesis starting this afternoon.  She had another episode here in the ED and there are 2 large clots in the sink where she has vomited blood.  She denies any ongoing symptoms, abdominal exam is benign.  She is hemodynamically stable and we will hold off on transfusion for now but given concern for upper GI bleed we will crossmatch 2 units PRBCs.  We will give IV dose of Protonix, no history of liver disease to suggest need for octreotide.  Plan to discuss with GI and discussed with hospitalist for admission.      ____________________________________________   FINAL  CLINICAL IMPRESSION(S) / ED DIAGNOSES  Final diagnoses:  Hematemesis with nausea     ED Discharge Orders    None       Note:  This document was prepared using Dragon voice recognition software and may include unintentional dictation errors.   Blake Divine, MD 07/25/20 2019

## 2020-07-25 NOTE — Progress Notes (Signed)
Brief note regarding plan, with full H&P to follow:  87 year old female with history of coronary artery disease status post three-vessel CABG in 2014 on chronic dual antiplatelet therapy with daily baby aspirin aspirin and Plavix, hypertension, hyperlipidemia, who is admitted with acute upper gastrointestinal bleed after presenting to [facility] ED complaining of 3 episodes of hematemesis over the last day.  Denies any residual nausea at this time, and has otherwise been asymptomatic, including no chest pain, shortness of breath, dizziness, presyncope, syncope, or abdominal pain.  Mild tachycardia at times into the low 100s, without hypotension.  Presenting hemoglobin 11 compared to baseline of 12.5-14.  Typed and screened in the ED.  Close trending of Every 4 hour H&H's has been ordered.  Add on INR.  Started on Protonix drip.  NPO.  No history of alcohol abuse or liver disease.  Case was discussed with the on-call gastroenterologist, Dr. Vicente Males, who did not feel that there was currently an indication for overnight EGD, but rather plans to evaluate the patient in the morning, and agreed with the above medical management in the interval.     Babs Bertin, DO Hospitalist

## 2020-07-26 ENCOUNTER — Other Ambulatory Visit: Payer: Self-pay | Admitting: Gastroenterology

## 2020-07-26 DIAGNOSIS — K920 Hematemesis: Secondary | ICD-10-CM

## 2020-07-26 DIAGNOSIS — D62 Acute posthemorrhagic anemia: Secondary | ICD-10-CM | POA: Diagnosis not present

## 2020-07-26 DIAGNOSIS — K922 Gastrointestinal hemorrhage, unspecified: Secondary | ICD-10-CM

## 2020-07-26 DIAGNOSIS — N19 Unspecified kidney failure: Secondary | ICD-10-CM

## 2020-07-26 LAB — CBC WITH DIFFERENTIAL/PLATELET
Abs Immature Granulocytes: 0.03 10*3/uL (ref 0.00–0.07)
Basophils Absolute: 0 10*3/uL (ref 0.0–0.1)
Basophils Relative: 0 %
Eosinophils Absolute: 0 10*3/uL (ref 0.0–0.5)
Eosinophils Relative: 0 %
HCT: 34.2 % — ABNORMAL LOW (ref 36.0–46.0)
Hemoglobin: 11.7 g/dL — ABNORMAL LOW (ref 12.0–15.0)
Immature Granulocytes: 0 %
Lymphocytes Relative: 26 %
Lymphs Abs: 1.9 10*3/uL (ref 0.7–4.0)
MCH: 31.6 pg (ref 26.0–34.0)
MCHC: 34.2 g/dL (ref 30.0–36.0)
MCV: 92.4 fL (ref 80.0–100.0)
Monocytes Absolute: 0.5 10*3/uL (ref 0.1–1.0)
Monocytes Relative: 7 %
Neutro Abs: 4.9 10*3/uL (ref 1.7–7.7)
Neutrophils Relative %: 67 %
Platelets: 132 10*3/uL — ABNORMAL LOW (ref 150–400)
RBC: 3.7 MIL/uL — ABNORMAL LOW (ref 3.87–5.11)
RDW: 14.5 % (ref 11.5–15.5)
WBC: 7.4 10*3/uL (ref 4.0–10.5)
nRBC: 0 % (ref 0.0–0.2)

## 2020-07-26 LAB — COMPREHENSIVE METABOLIC PANEL
ALT: 31 U/L (ref 0–44)
AST: 36 U/L (ref 15–41)
Albumin: 3.3 g/dL — ABNORMAL LOW (ref 3.5–5.0)
Alkaline Phosphatase: 44 U/L (ref 38–126)
Anion gap: 5 (ref 5–15)
BUN: 39 mg/dL — ABNORMAL HIGH (ref 8–23)
CO2: 25 mmol/L (ref 22–32)
Calcium: 8.5 mg/dL — ABNORMAL LOW (ref 8.9–10.3)
Chloride: 109 mmol/L (ref 98–111)
Creatinine, Ser: 0.77 mg/dL (ref 0.44–1.00)
GFR, Estimated: >60 mL/min (ref >60)
Glucose, Bld: 95 mg/dL (ref 70–99)
Potassium: 3.2 mmol/L — ABNORMAL LOW (ref 3.5–5.1)
Sodium: 139 mmol/L (ref 135–145)
Total Bilirubin: 1.1 mg/dL (ref 0.3–1.2)
Total Protein: 5.7 g/dL — ABNORMAL LOW (ref 6.5–8.1)

## 2020-07-26 LAB — MAGNESIUM
Magnesium: 2.6 mg/dL — ABNORMAL HIGH (ref 1.7–2.4)
Magnesium: 2.5 mg/dL — ABNORMAL HIGH (ref 1.7–2.4)

## 2020-07-26 LAB — HEMOGLOBIN AND HEMATOCRIT, BLOOD
HCT: 30.2 % — ABNORMAL LOW (ref 36.0–46.0)
HCT: 37 % (ref 36.0–46.0)
Hemoglobin: 9.9 g/dL — ABNORMAL LOW (ref 12.0–15.0)
Hemoglobin: 12.2 g/dL (ref 12.0–15.0)

## 2020-07-26 LAB — TROPONIN I (HIGH SENSITIVITY)
Troponin I (High Sensitivity): 13 ng/L (ref <18)
Troponin I (High Sensitivity): 12 ng/L (ref <18)

## 2020-07-26 LAB — IRON AND TIBC
Iron: 142 ug/dL (ref 28–170)
Saturation Ratios: 53 % — ABNORMAL HIGH (ref 10.4–31.8)
TIBC: 270 ug/dL (ref 250–450)
UIBC: 128 ug/dL

## 2020-07-26 LAB — RETICULOCYTES
Immature Retic Fract: 12.5 % (ref 2.3–15.9)
RBC.: 3.26 MIL/uL — ABNORMAL LOW (ref 3.87–5.11)
Retic Count, Absolute: 58.4 10*3/uL (ref 19.0–186.0)
Retic Ct Pct: 1.8 % (ref 0.4–3.1)

## 2020-07-26 LAB — FERRITIN: Ferritin: 29 ng/mL (ref 11–307)

## 2020-07-26 LAB — PROTIME-INR
INR: 1.2 (ref 0.8–1.2)
INR: 1.2 (ref 0.8–1.2)
Prothrombin Time: 14.9 seconds (ref 11.4–15.2)
Prothrombin Time: 14.8 seconds (ref 11.4–15.2)

## 2020-07-26 LAB — FOLATE: Folate: 8.1 ng/mL (ref >5.9)

## 2020-07-26 MED ORDER — SODIUM CHLORIDE 0.9% IV SOLUTION
Freq: Once | INTRAVENOUS | Status: DC
  Filled 2020-07-26: qty 250

## 2020-07-26 MED ORDER — OMEPRAZOLE 40 MG PO CPDR: 40.0000 mg | DELAYED_RELEASE_CAPSULE | Freq: Two times a day (BID) | ORAL | 1 refills | Status: DC

## 2020-07-26 MED ORDER — POTASSIUM CHLORIDE CRYS ER 20 MEQ PO TBCR
40.0000 meq | EXTENDED_RELEASE_TABLET | Freq: Once | ORAL | Status: AC
  Administered 2020-07-26: 40 meq via ORAL
  Filled 2020-07-26: qty 2

## 2020-07-26 NOTE — Progress Notes (Signed)
Dr Marius Ditch has called in script for Prilosec 40 mg po BID at D/C.

## 2020-07-26 NOTE — Plan of Care (Signed)

## 2020-07-26 NOTE — ED Notes (Signed)
Pt assisted up to the toilet, no distress noted at this time cont to monitor, pt given an additional warm blanket upon request

## 2020-07-26 NOTE — Consult Note (Signed)
Cephas Darby, MD [address]  [place]  [place], [place] [address]  Main: [phone]  Fax: [phone] Pager: [phone]   Consultation  Referring Provider:     No ref. provider found Primary Care Physician:  Derinda Late, MD Primary Gastroenterologist: Althia Forts         Reason for Consultation:     Hematemesis, acute blood loss anemia  Date of Admission:  07/25/2020 Date of Consultation:  07/26/2020         HPI:   Sally Miller is a 87 y.o. female with history of coronary artery disease s/p CABG, s/p PCI on DAPT who is admitted with hematemesis.  Patient reports that she had 2 episodes of active hematemesis, large amount with blood clots.  She had a near syncopal episode as well.  Patient was hemodynamically stable on admission, labs revealed drop in hemoglobin from 13.7 in October to 11 on admission, further dropped to 9.9 today, patient received blood transfusion and her hemoglobin improved to 12.2.  Patient's Plavix has been held since admission.  She was also found to have elevated BUN/creatinine 39/0.77.  Patient is started on pantoprazole drip and kept NPO.  When I interviewed the patient, patient reports that she does not want upper endoscopy and would like to come back as outpatient for the procedure.  She was initially concerned about upper endoscopy to be done without sedation as she had the same procedure done about 15 to 20 years ago when she presented with hematemesis.  I informed her that we perform upper endoscopy under anesthesia.  However, she was persistent that she did not want to undergo upper endoscopy and would like to come back after her daughter's wedding which is within next 2 weeks.  Patient's daughter is bedside.  Patient does not take PPI as outpatient.  Patient denies any epigastric pain, chest pain, nausea or vomiting, melena or further episodes of hematemesis  NSAIDs: None  Antiplts/Anticoagulants/Anti thrombotics: Plavix for history of  coronary disease  GI Procedures: Upper endoscopy 15 to 20 years ago  Past Medical History:  Diagnosis Date  . CAD (coronary artery disease)    a. S/P prior LAD/LCX stenting;  b. S/P prior CABG x 3; c. 06/2012 Cath: LM 20, LAD 100p, 54mISR, LCX 20p ISR, 657mSR, RCA 100 CTO, patent LIMA to LAD, SVG to diag2, SVG to OM2.   . Marland KitchenTN (hypertension)   . Hyperlipemia   . [place]  . Syncope    a. 06/2012.    Past Surgical History:  Procedure Laterality Date  . CATARACT EXTRACTION, BILATERAL    . CORONARY ANGIOPLASTY    . CORONARY ARTERY BYPASS GRAFT  1996  . RENAL ARTERY STENT      Prior to Admission medications   Medication Sig Start Date End Date Taking? Authorizing Provider  ascorbic acid (VITAMIN C) 1000 MG tablet Take 1,000 mg by mouth daily.    Yes [provider]  aspirin 81 MG tablet Take 1 tablet (81 mg total) by mouth daily. 12/26/12  Yes Nahser, PhWonda ChengMD  cholecalciferol (VITAMIN D3) 25 MCG (1000 UNIT) tablet Take 1,000 Units by mouth daily.   Yes [provider]  clopidogrel (PLAVIX) 75 MG tablet Take 1 tablet (75 mg total) by mouth daily. 10/13/19  Yes Gollan, TiKathlene NovemberMD  Cyanocobalamin (VITAMIN B 12) 100 MCG LOZG Take by mouth daily.   Yes [provider]  ezetimibe (ZETIA) 10 MG tablet  Take 1 tablet (10 mg total) by mouth daily. 10/13/19  Yes Gollan, Kathlene November, MD  fluticasone (FLONASE) 50 MCG/ACT nasal spray Place 1 spray into both nostrils daily. 01/21/16  Yes [provider]  isosorbide mononitrate (IMDUR) 30 MG 24 hr tablet Take 1 tablet (30 mg total) by mouth daily. 10/13/19  Yes Minna Merritts, MD  metoprolol tartrate (LOPRESSOR) 25 MG tablet Take 1 tablet (25 mg total) by mouth 2 (two) times daily. 10/13/19  Yes Gollan, Kathlene November, MD  nitroGLYCERIN (NITROSTAT) 0.4 MG SL tablet Place 1 tablet (0.4 mg total) under the tongue every 5 (five) minutes as needed. 04/24/16  Yes Gladstone Lighter, MD  rosuvastatin  (CRESTOR) 40 MG tablet Take 1 tablet (40 mg total) by mouth daily. 10/13/19  Yes Gollan, Kathlene November, MD  feeding supplement, ENSURE ENLIVE, (ENSURE ENLIVE) LIQD Take 237 mLs by mouth 2 (two) times daily between meals. 04/24/16   Gladstone Lighter, MD  Multiple Vitamins-Minerals (MULTI ADULT GUMMIES PO) Take 1 tablet by mouth daily. Patient not taking: No sig reported    [provider]  Multiple Vitamins-Minerals (PRESERVISION AREDS PO) Take 1 capsule by mouth 2 (two) times daily. Patient not taking: No sig reported    [provider]  Olopatadine HCl 0.2 % SOLN Place 1 drop into both eyes daily.  Patient not taking: Reported on 07/25/2020 04/12/16   [provider]    Current Facility-Administered Medications:  .  0.9 %  sodium chloride infusion (Manually program via Guardrails IV Fluids), , Intravenous, Once, Howerter, Justin B, DO .  0.9 %  sodium chloride infusion, 10 mL/hr, Intravenous, Once, Howerter, Justin B, DO .  0.9 %  sodium chloride infusion, , Intravenous, Continuous, Howerter, Justin B, DO, Last Rate: 50 mL/hr at 07/25/20 2215, New Bag at 07/25/20 2215 .  ondansetron (ZOFRAN) injection 4 mg, 4 mg, Intravenous, Q6H PRN, Howerter, Justin B, DO .  pantoprazole (PROTONIX) 80 mg in sodium chloride 0.9 % 100 mL (0.8 mg/mL) infusion, 8 mg/hr, Intravenous, Continuous, Howerter, Justin B, DO, Stopped at 07/26/20 9476  Current Outpatient Medications:  .  ascorbic acid (VITAMIN C) 1000 MG tablet, Take 1,000 mg by mouth daily. , Disp: , Rfl:  .  aspirin 81 MG tablet, Take 1 tablet (81 mg total) by mouth daily., Disp: 30 tablet, Rfl: 6 .  cholecalciferol (VITAMIN D3) 25 MCG (1000 UNIT) tablet, Take 1,000 Units by mouth daily., Disp: , Rfl:  .  Cyanocobalamin (VITAMIN B 12) 100 MCG LOZG, Take by mouth daily., Disp: , Rfl:  .  ezetimibe (ZETIA) 10 MG tablet, Take 1 tablet (10 mg total) by mouth daily., Disp: 90 tablet, Rfl: 3 .  fluticasone (FLONASE) 50 MCG/ACT nasal  spray, Place 1 spray into both nostrils daily., Disp: , Rfl:  .  isosorbide mononitrate (IMDUR) 30 MG 24 hr tablet, Take 1 tablet (30 mg total) by mouth daily., Disp: 90 tablet, Rfl: 3 .  metoprolol tartrate (LOPRESSOR) 25 MG tablet, Take 1 tablet (25 mg total) by mouth 2 (two) times daily., Disp: 180 tablet, Rfl: 3 .  nitroGLYCERIN (NITROSTAT) 0.4 MG SL tablet, Place 1 tablet (0.4 mg total) under the tongue every 5 (five) minutes as needed., Disp: 30 tablet, Rfl: 3 .  rosuvastatin (CRESTOR) 40 MG tablet, Take 1 tablet (40 mg total) by mouth daily., Disp: 90 tablet, Rfl: 3 .  feeding supplement, ENSURE ENLIVE, (ENSURE ENLIVE) LIQD, Take 237 mLs by mouth 2 (two) times daily between meals., Disp: 237 mL,  Rfl: 12 .  omeprazole (PRILOSEC) 40 MG capsule, Take 1 capsule (40 mg total) by mouth 2 (two) times daily before a meal., Disp: 60 capsule, Rfl: 1  Family History  Problem Relation Age of Onset  . Heart attack Brother   . Heart attack Brother   . Heart failure Sister   . Coronary artery disease Sister   . Coronary artery disease Brother        had cabg     Social History   Tobacco Use  . Smoking status: Never Smoker  . Smokeless tobacco: Never Used  Substance Use Topics  . Alcohol use: No  . Drug use: No    Allergies as of 07/25/2020 - Review Complete 07/25/2020  Allergen Reaction Noted  . Other  04/22/2016    Review of Systems:    All systems reviewed and negative except where noted in HPI.   Physical Exam:  Vital signs in last 24 hours: Temp:  [97.6 F (36.4 C)-98.7 F (37.1 C)] 98.7 F (37.1 C) (05/13 1145) Pulse Rate:  [79-107] 96 (05/13 1145) Resp:  [15-46] 16 (05/13 1145) BP: (122-163)/(53-85) 160/85 (05/13 1145) SpO2:  [96 %-100 %] 100 % (05/13 1145) Weight:  [37.2 kg] 37.2 kg (05/12 1852) Last BM Date: 07/25/20 General:   Pleasant, cooperative in NAD Head:  Normocephalic and atraumatic. Eyes:   No icterus.   Conjunctiva pink. PERRLA. Ears:  Normal auditory  acuity. Neck:  Supple; no masses or thyroidomegaly Lungs: Respirations even and unlabored. Lungs clear to auscultation bilaterally.   No wheezes, crackles, or rhonchi.  Heart:  Regular rate and rhythm;  Without murmur, clicks, rubs or gallops Abdomen:  Soft, nondistended, nontender. Normal bowel sounds. No appreciable masses or hepatomegaly.  No rebound or guarding.  Rectal:  Not performed. Msk:  Symmetrical without gross deformities.  Strength normal Extremities:  Without edema, cyanosis or clubbing. Neurologic:  Alert and oriented x3;  grossly normal neurologically. Skin:  Intact without significant lesions or rashes. Psych:  Alert and cooperative. Normal affect.  LAB RESULTS: CBC Latest Ref Rng & Units 07/26/2020 07/26/2020 07/26/2020  WBC 4.0 - 10.5 K/uL - 7.4 -  Hemoglobin 12.0 - 15.0 g/dL 12.2 11.7(L) 9.9(L)  Hematocrit 36.0 - 46.0 % 37.0 34.2(L) 30.2(L)  Platelets 150 - 400 K/uL - 132(L) -    BMET BMP Latest Ref Rng & Units 07/26/2020 07/25/2020 08/15/2018  Glucose 70 - 99 mg/dL 95 154(H) 138(H)  BUN 8 - 23 mg/dL 39(H) 34(H) 18  Creatinine 0.44 - 1.00 mg/dL 0.77 1.02(H) 0.95  Sodium 135 - 145 mmol/L 139 138 138  Potassium 3.5 - 5.1 mmol/L 3.2(L) 3.6 3.6  Chloride 98 - 111 mmol/L 109 104 101  CO2 22 - 32 mmol/L 25 27 28   Calcium 8.9 - 10.3 mg/dL 8.5(L) 9.1 9.2    LFT Hepatic Function Latest Ref Rng & Units 07/26/2020 07/25/2020 07/12/2014  Total Protein 6.5 - 8.1 g/dL 5.7(L) 6.3(L) 7.4  Albumin 3.5 - 5.0 g/dL 3.3(L) 3.5 4.7  AST 15 - 41 U/L 36 44(H) 29  ALT 0 - 44 U/L 31 38 18  Alk Phosphatase 38 - 126 U/L 44 51 87  Total Bilirubin 0.3 - 1.2 mg/dL 1.1 0.9 0.6  Bilirubin, Direct 0.0 - 0.3 mg/dL - - -     STUDIES: No results found.    Impression / Plan:   Sally Miller is a 87 y.o. female with history of significant coronary artery disease s/p CABG, PCI on DAPT who is admitted  with 2 episodes of moderate volume hematemesis and acute blood loss anemia  Hematemesis with  elevated BUN/creatinine concerning for acute upper GI bleed Plavix has been held since 5/12 Patient is on pantoprazole drip Patient is refusing to undergo upper endoscopy at this time as she feels fine and would like to continue acid suppressive therapy and hold Plavix for next 2 to 3 weeks until her daughter's wedding.  She said she will call my office to schedule upper endoscopy or follow-up with me in office after her daughter's wedding. I explained to both patient and her daughter that I will not be able to risk stratify the risk of rebleeding until I do upper endoscopy and rule out any ulcer.  I also discussed with her about other possible etiologies as upper GI source of bleeding. We have discussed regarding interruption of Plavix with her cardiologist, Dr. Rockey Situ who agreed to hold Plavix for next few weeks until she undergoes upper endoscopy.  She will continue aspirin 81 mg daily due to severe coronary artery disease She is discharged on Prilosec 40 mg twice daily for 30 days.  She does have my clinic number to call my office.  She does understand to return to ER if she develops recurrence of hematemesis or ongoing melena or any other symptoms of anemia  Thank you for involving me in the care of this patient.      LOS: 0 days   Sherri Sear, MD  07/26/2020, 6:24 PM   Note: This dictation was prepared with Dragon dictation along with smaller phrase technology. Any transcriptional errors that result from this process are unintentional.

## 2020-07-26 NOTE — Discharge Instructions (Signed)
Hematemesis Hematemesis is when you vomit blood. It is a sign of bleeding in the upper GI tract (gastrointestinal tract). The upper GI tract includes the mouth, throat, esophagus, stomach, and the upper part of the small intestine (duodenum). Hematemesis is usually caused by bleeding in the esophagus or stomach. You may suddenly vomit bright red blood. Or, the blood may look like coffee grounds. You may also have other symptoms, such as:  Stomach pain.  Heartburn.  Stool (feces) that looks black and tarry. Follow these instructions at home:  Take over-the-counter and prescription medicines only as told by your health care provider.  Do not take NSAIDs, including aspirin and ibuprofen, unless your health care provider approves. These medicines can increase bleeding.  Rest as needed.  Drink enough fluids to keep your urine pale yellow. Take small sips of fluid at a time.  Do not drink alcohol.  Do not use any products that contain nicotine or tobacco, such as cigarettes and e-cigarettes. If you need help quitting, ask your health care provider.  Keep all follow-up visits as told by your health care provider. This is important.   Contact a health care provider if:  You have more blood in your vomit.  Your vomiting of blood begins again after it has stopped.  You have persistent stomach pain.  You have nausea, indigestion, or heartburn. Get help right away if:  You faint.  You feel weak or dizzy.  You are urinating less than normal or not at all.  You vomit up: ? Large amounts of blood or dark material that may look like coffee grounds. ? Bright red blood.  You have any of the following: ? Persistent vomiting. ? A rapid heartbeat. ? Blood in your stool. ? Chest pain. ? Difficulty breathing. These symptoms may represent a serious problem that is an emergency. Do not wait to see if the symptoms will go away. Get medical help right away. Call your local emergency services  (911 in the [place]). Do not drive yourself to the hospital. Summary  Hematemesis is when you vomit blood. It is a sign of bleeding in the upper GI tract (gastrointestinal tract).  Hematemesis is usually caused by bleeding in the esophagus or stomach.  Do not take NSAIDs (including aspirin and ibuprofen), drink alcohol, or use tobacco products.  Take over-the-counter and prescription medicines only as told by your health care provider. This information is not intended to replace advice given to you by your health care provider. Make sure you discuss any questions you have with your health care provider. Document Revised: 03/12/2017 Document Reviewed: 03/12/2017 Elsevier Patient Education  2021 [employer]  

## 2020-07-27 LAB — BPAM RBC
Blood Product Expiration Date: 202205242359
Blood Product Expiration Date: 202206072359
Blood Product Expiration Date: 202206152359
Blood Product Expiration Date: 202206172359
Blood Product Expiration Date: 202206172359
ISSUE DATE / TIME: 202205130134
Unit Type and Rh: 5100
Unit Type and Rh: 5100
Unit Type and Rh: 5100
Unit Type and Rh: 5100
Unit Type and Rh: 9500

## 2020-07-27 LAB — TYPE AND SCREEN
ABO/RH(D): O POS
Antibody Screen: POSITIVE
Donor AG Type: NEGATIVE
Unit division: 0
Unit division: 0
Unit division: 0
Unit division: 0
Unit division: 0

## 2020-07-27 LAB — PREPARE RBC (CROSSMATCH)

## 2020-07-29 NOTE — Discharge Summary (Signed)
[facility] NAME: Sally Miller    MR#:  [id number]  DATE OF BIRTH:  07-01-1930  DATE OF ADMISSION:  07/25/2020   ADMITTING PHYSICIAN: Rhetta Mura, DO  DATE OF DISCHARGE: 07/26/2020  3:20 PM  PRIMARY CARE PHYSICIAN: Derinda Late, MD   ADMISSION DIAGNOSIS:  Acute upper GI bleed [K92.2] Hematemesis with nausea [K92.0] DISCHARGE DIAGNOSIS:  Principal Problem:   Acute upper GI bleed Active Problems:   Essential hypertension   Mixed hyperlipidemia   Acute blood loss anemia   Nausea & vomiting   Acute prerenal azotemia  SECONDARY DIAGNOSIS:   Past Medical History:  Diagnosis Date  . CAD (coronary artery disease)    a. S/P prior LAD/LCX stenting;  b. S/P prior CABG x 3; c. 06/2012 Cath: LM 20, LAD 100p, 2m ISR, LCX 20p ISR, 78m ISR, RCA 100 CTO, patent LIMA to LAD, SVG to diag2, SVG to OM2.   Marland Kitchen HTN (hypertension)   . Hyperlipemia   . MI (myocardial infarction) ([place])   . Syncope    a. 06/2012.   HOSPITAL COURSE:  Sally Miller is a 87 y.o. female with medical history significant for coronary artery disease status post three-vessel CABG in April 2014, hypertension, hyperlipidemia, acute upper gastrointestinal bleed in the 1980s admitted with acute upper GI bleed after presenting from home to [facility] ED complaining of hematemesis.   Hematemesis/acute upper GI bleed Patient was started on Protonix drip and switched over to oral PPI at discharge Patient was seen by GI Dr. Marius Ditch but patient refused to undergo any kind of endoscopy at this time.  She is agreeable to follow-up outpatient with GI -After discussion with cardiology Dr. Rockey Situ and GI Dr. Marius Ditch, decision was made to hold her Plavix at this time until outpatient follow-up with GI and can be resumed after endoscopy.  I have requested her to hold her baby aspirin over the weekend and restart on 07/29/2020 if no further bleeding.  Her daughter and patient both are agreeable with  this plan and prefer to go home. -Patient has not had any further bleeding in the hospital - Dr. Marius Ditch has sent in prescription for Prilosec 40 mg p.o. twice daily.  DISCHARGE CONDITIONS:  Stable CONSULTS OBTAINED:  Treatment Team:  Lin Landsman, MD DRUG ALLERGIES:   Allergies  Allergen Reactions  . Other     Dye used for chemical stress test.    DISCHARGE MEDICATIONS:   Allergies as of 07/26/2020      Reactions   Other    Dye used for chemical stress test.       Medication List    STOP taking these medications   clopidogrel 75 MG tablet Commonly known as: PLAVIX   MULTI ADULT GUMMIES PO   Olopatadine HCl 0.2 % Soln   PRESERVISION AREDS PO     TAKE these medications   ascorbic acid 1000 MG tablet Commonly known as: VITAMIN C Take 1,000 mg by mouth daily.   aspirin 81 MG tablet Take 1 tablet (81 mg total) by mouth daily.   cholecalciferol 25 MCG (1000 UNIT) tablet Commonly known as: VITAMIN D3 Take 1,000 Units by mouth daily.   ezetimibe 10 MG tablet Commonly known as: ZETIA Take 1 tablet (10 mg total) by mouth daily.   feeding supplement Liqd Take 237 mLs by mouth 2 (two) times daily between meals.   fluticasone 50 MCG/ACT nasal spray Commonly known as:  FLONASE Place 1 spray into both nostrils daily.   isosorbide mononitrate 30 MG 24 hr tablet Commonly known as: IMDUR Take 1 tablet (30 mg total) by mouth daily.   metoprolol tartrate 25 MG tablet Commonly known as: LOPRESSOR Take 1 tablet (25 mg total) by mouth 2 (two) times daily.   nitroGLYCERIN 0.4 MG SL tablet Commonly known as: NITROSTAT Place 1 tablet (0.4 mg total) under the tongue every 5 (five) minutes as needed.   rosuvastatin 40 MG tablet Commonly known as: CRESTOR Take 1 tablet (40 mg total) by mouth daily.   Vitamin B 12 100 MCG Lozg Take by mouth daily.      DISCHARGE INSTRUCTIONS:   DIET:  Cardiac diet DISCHARGE CONDITION:  Stable ACTIVITY:  Activity as  tolerated OXYGEN:  Home Oxygen: No.  Oxygen Delivery: room air DISCHARGE LOCATION:  home   If you experience worsening of your admission symptoms, develop shortness of breath, life threatening emergency, suicidal or homicidal thoughts you must seek medical attention immediately by calling 911 or calling your MD immediately  if symptoms less severe.  You Must read complete instructions/literature along with all the possible adverse reactions/side effects for all the Medicines you take and that have been prescribed to you. Take any new Medicines after you have completely understood and accpet all the possible adverse reactions/side effects.   Please note  You were cared for by a hospitalist during your hospital stay. If you have any questions about your discharge medications or the care you received while you were in the hospital after you are discharged, you can call the unit and asked to speak with the hospitalist on call if the hospitalist that took care of you is not available. Once you are discharged, your primary care physician will handle any further medical issues. Please note that NO REFILLS for any discharge medications will be authorized once you are discharged, as it is imperative that you return to your primary care physician (or establish a relationship with a primary care physician if you do not have one) for your aftercare needs so that they can reassess your need for medications and monitor your lab values.    On the day of Discharge:  VITAL SIGNS:  Blood pressure (!) 160/85, pulse 96, temperature 98.7 F (37.1 C), temperature source Oral, resp. rate 16, height 5\' 1"  (1.549 m), weight 37.2 kg, SpO2 100 %. PHYSICAL EXAMINATION:  GENERAL:  87 y.o.-year-old patient lying in the bed with no acute distress.  EYES: Pupils equal, round, reactive to light and accommodation. No scleral icterus. Extraocular muscles intact.  HEENT: Head atraumatic, normocephalic. Oropharynx and  nasopharynx clear.  NECK:  Supple, no jugular venous distention. No thyroid enlargement, no tenderness.  LUNGS: Normal breath sounds bilaterally, no wheezing, rales,rhonchi or crepitation. No use of accessory muscles of respiration.  CARDIOVASCULAR: S1, S2 normal. No murmurs, rubs, or gallops.  ABDOMEN: Soft, non-tender, non-distended. Bowel sounds present. No organomegaly or mass.  EXTREMITIES: No pedal edema, cyanosis, or clubbing.  NEUROLOGIC: Cranial nerves II through XII are intact. Muscle strength 5/5 in all extremities. Sensation intact. Gait not checked.  PSYCHIATRIC: The patient is alert and oriented x 3.  SKIN: No obvious rash, lesion, or ulcer.  DATA REVIEW:   CBC Recent Labs  Lab 07/26/20 0519 07/26/20 1148  WBC 7.4  --   HGB 11.7* 12.2  HCT 34.2* 37.0  PLT 132*  --     Chemistries  Recent Labs  Lab 07/26/20 0519  NA 139  K 3.2*  CL 109  CO2 25  GLUCOSE 95  BUN 39*  CREATININE 0.77  CALCIUM 8.5*  MG 2.5*  AST 36  ALT 31  ALKPHOS 44  BILITOT 1.1     Outpatient follow-up  Follow-up Information    Derinda Late, MD. Schedule an appointment as soon as possible for a visit in 1 week(s).   Specialty: Family Medicine Contact information: [address] [facility] [place] [address] [phone]        Minna Merritts, MD. Schedule an appointment as soon as possible for a visit in 1 week(s).   Specialty: Cardiology Contact information: [place] [address] [phone]        Lin Landsman, MD. Schedule an appointment as soon as possible for a visit in 2 week(s).   Specialty: Gastroenterology Contact information: [place] [address] [phone]                  Management plans discussed with the patient, family and they are in agreement.  CODE STATUS: Prior   TOTAL TIME TAKING CARE OF THIS PATIENT: 45 minutes.    Max Sane M.D on 07/29/2020 at 4:38 PM  [facility]   CC: Primary care physician; Derinda Late, MD   Note: This dictation was prepared with Dragon dictation along with smaller phrase technology. Any transcriptional errors that result from this process are unintentional.

## 2020-08-02 LAB — METHYLMALONIC ACID, SERUM: Methylmalonic Acid, Quantitative: 240 nmol/L (ref 0–378)

## 2020-09-03 ENCOUNTER — Encounter: Payer: Self-pay | Admitting: Cardiovascular Disease

## 2020-09-03 ENCOUNTER — Other Ambulatory Visit: Payer: Self-pay

## 2020-09-03 ENCOUNTER — Ambulatory Visit (INDEPENDENT_AMBULATORY_CARE_PROVIDER_SITE_OTHER): Payer: Medicare Other | Admitting: Cardiovascular Disease

## 2020-09-03 VITALS — BP 120/70 | HR 74 | Ht 61.0 in | Wt 81.4 lb

## 2020-09-03 DIAGNOSIS — I10 Essential (primary) hypertension: Secondary | ICD-10-CM | POA: Diagnosis not present

## 2020-09-03 DIAGNOSIS — R0602 Shortness of breath: Secondary | ICD-10-CM | POA: Diagnosis not present

## 2020-09-03 DIAGNOSIS — I25118 Atherosclerotic heart disease of native coronary artery with other forms of angina pectoris: Secondary | ICD-10-CM

## 2020-09-03 DIAGNOSIS — Z951 Presence of aortocoronary bypass graft: Secondary | ICD-10-CM | POA: Diagnosis not present

## 2020-09-03 DIAGNOSIS — E785 Hyperlipidemia, unspecified: Secondary | ICD-10-CM

## 2020-09-03 MED ORDER — EZETIMIBE 10 MG PO TABS: 10.0000 mg | ORAL_TABLET | Freq: Every day | ORAL | 3 refills | Status: DC

## 2020-09-03 NOTE — Patient Instructions (Addendum)
Medication Instructions:  No changes  If you need a refill on your cardiac medications before your next appointment, please call your pharmacy.   Lab work: No new labs needed  Testing/Procedures: No new testing needed  Follow-Up: At [facility], you and your health needs are our priority.  As part of our continuing mission to provide you with exceptional heart care, we have created designated Provider Care Teams.  These Care Teams include your primary Cardiologist (physician) and Advanced Practice Providers (APPs -  Physician Assistants and Nurse Practitioners) who all work together to provide you with the care you need, when you need it.  You will need a follow up appointment in 12 months  Providers on your designated Care Team:   Christopher Berge, NP Ryan Dunn, PA-C Jacquelyn Visser, PA-C Cadence Furth, PA-C  COVID-19 Vaccine Information can be found at: [email or web address] For questions related to vaccine distribution or appointments, please email [email or web address] or call [phone].    

## 2020-09-03 NOTE — Progress Notes (Signed)
Cardiology Office Note  Date:  09/03/2020   ID:  Sally Miller, DOB [birth year], MRN [id number]  PCP:  Derinda Late, MD   Chief Complaint  Patient presents with   [facility] follow up     "Doing well." Medications reviewed by the patient verbally.     HPI:  87 yo woman with PMH of Coronary artery disease- s/p CABG 1996 native right coronary artery was chronically occluded, collaterals.  patent LIMA to LAD, SVG to d, SVG to OM.  Hypertension Hyperlipidemia Syncope (dehydrated?) Chronic SOB Who presents for routine follow-up of her coronary artery disease  Follow-up today reports that she is Active, traveling  [place] in yard, no injury Otherwise no gait instability  No angina, denies shortness of breath on exertion  Drinking lots of acid Stress, family member died  One month ago, hemaptysis, N/V Hospital records reviewed Needed tranfusion Did not do EGD as she is on Plavix and declined procedure  LFTs higher, was told to decrease omeprazole down to 1 a day from 40 twice daily Thinks that she will take the rest of the prescription and stop  Cholesterol running higher, 200  EKG personally reviewed by myself on todays visit Normal sinus rhythm rate 74 bpm no significant ST-T wave changes  Of past medical history reviewed Last seen in clinic October 2020 Previously seen in the emergency room August 15, 2018 for chest pain Was hypertensive at the time, Cardiac work-up negative  hospital February 2018 for chest pain in the setting of poorly controlled hypertension Previously declined cardiac catheterization, Also reported allergy to stress testing   EKG personally reviewed by myself on todays visit Shows normal sinus rhythm rate 81 bpm no significant ST-T wave changes  PMH:   has a past medical history of CAD (coronary artery disease), HTN (hypertension), Hyperlipemia, MI (myocardial infarction) ([place]), and Syncope.  PSH:    Past Surgical History:  Procedure Laterality  Date   CATARACT EXTRACTION, BILATERAL     CORONARY ANGIOPLASTY     CORONARY ARTERY BYPASS GRAFT  1996   RENAL ARTERY STENT      Current Outpatient Medications  Medication Sig Dispense Refill   ascorbic acid (VITAMIN C) 1000 MG tablet Take 1,000 mg by mouth daily.      aspirin 81 MG tablet Take 1 tablet (81 mg total) by mouth daily. 30 tablet 6   cholecalciferol (VITAMIN D3) 25 MCG (1000 UNIT) tablet Take 1,000 Units by mouth daily.     Cyanocobalamin (VITAMIN B 12) 100 MCG LOZG Take by mouth daily.     feeding supplement, ENSURE ENLIVE, (ENSURE ENLIVE) LIQD Take 237 mLs by mouth 2 (two) times daily between meals. 237 mL 12   fluticasone (FLONASE) 50 MCG/ACT nasal spray Place 1 spray into both nostrils daily.     isosorbide mononitrate (IMDUR) 30 MG 24 hr tablet Take 1 tablet (30 mg total) by mouth daily. 90 tablet 3   metoprolol tartrate (LOPRESSOR) 25 MG tablet Take 1 tablet (25 mg total) by mouth 2 (two) times daily. 180 tablet 3   nitroGLYCERIN (NITROSTAT) 0.4 MG SL tablet Place 1 tablet (0.4 mg total) under the tongue every 5 (five) minutes as needed. 30 tablet 3   omeprazole (PRILOSEC) 40 MG capsule Take 1 capsule (40 mg total) by mouth 2 (two) times daily before a meal. 60 capsule 1   rosuvastatin (CRESTOR) 40 MG tablet Take 1 tablet (40 mg total) by mouth daily. 90 tablet 3   ezetimibe (ZETIA) 10 MG tablet  Take 1 tablet (10 mg total) by mouth daily. 90 tablet 3   No current facility-administered medications for this visit.     Allergies:   Other   Social History:  The patient  reports that she has never smoked. She has never used smokeless tobacco. She reports that she does not drink alcohol and does not use drugs.   Family History:   family history includes Coronary artery disease in her brother and sister; Heart attack in her brother and brother; Heart failure in her sister.    Review of Systems: Review of Systems  Constitutional: Negative.   Respiratory: Negative.     Cardiovascular: Negative.   Gastrointestinal: Negative.   Musculoskeletal: Negative.   Neurological: Negative.   Psychiatric/Behavioral: Negative.    All other systems reviewed and are negative.  PHYSICAL EXAM: VS:  BP 120/70 (BP Location: Left Arm, Patient Position: Sitting, Cuff Size: Normal)   Pulse 74   Ht 5\' 1"  (1.549 m)   Wt 81 lb 6 oz (36.9 kg)   SpO2 98%   BMI 15.38 kg/m  , BMI Body mass index is 15.38 kg/m. Constitutional:  oriented to person, place, and time. No distress.  HENT:  Head: Grossly normal Eyes:  no discharge. No scleral icterus.  Neck: No JVD, no carotid bruits  Cardiovascular: Regular rate and rhythm, no murmurs appreciated Pulmonary/Chest: Clear to auscultation bilaterally, no wheezes or rails Abdominal: Soft.  no distension.  no tenderness.  Musculoskeletal: Normal range of motion Neurological:  normal muscle tone. Coordination normal. No atrophy Skin: Skin warm and dry Psychiatric: normal affect, pleasant   Recent Labs: 07/26/2020: ALT 31; BUN 39; Creatinine, Ser 0.77; Hemoglobin 12.2; Magnesium 2.5; Platelets 132; Potassium 3.2; Sodium 139    Lipid Panel Lab Results  Component Value Date   CHOL 208 (H) 07/12/2014   HDL 76 07/12/2014   LDLCALC 115 (H) 07/12/2014   TRIG 86 07/12/2014    Wt Readings from Last 3 Encounters:  09/03/20 81 lb 6 oz (36.9 kg)  07/25/20 82 lb 0.2 oz (37.2 kg)  10/13/19 (!) 82 lb (37.2 kg)     ASSESSMENT AND PLAN:  Coronary artery disease of native artery of native heart with stable angina pectoris (HCC) Currently with no symptoms of angina. No further workup at this time. Continue current medication regimen. Stressed importance of taking her Crestor, numbers are above goal  Essential hypertension Blood pressure is well controlled on today's visit. No changes made to the medications.   Mixed hyperlipidemia On Crestor Zetia Stressed importance of taking her medications, numbers above goal  Hx of  CABG Previously declined any testing Denies anginal symptoms  PAD Previous stenosis to the right renal artery on previous catheterization 2014 also stent to the iliac vessel/artery  Previously declined ultrasound Medical management  GI bleed Followed by GI and primary care Recent vomiting of blood Declined EGD, cardiac PPI LFTs being monitored    Total encounter time more than 25 minutes  Greater than 50% was spent in counseling and coordination of care with the patient   Orders Placed This Encounter  Procedures   EKG 12-Lead     Signed, Esmond Plants, M.D., Ph.D. 09/03/2020  [place], [place]

## 2020-10-14 ENCOUNTER — Ambulatory Visit: Payer: Medicare Other | Admitting: Cardiovascular Disease

## 2021-02-17 ENCOUNTER — Other Ambulatory Visit: Payer: Self-pay

## 2021-02-17 ENCOUNTER — Ambulatory Visit (INDEPENDENT_AMBULATORY_CARE_PROVIDER_SITE_OTHER): Payer: Medicare Other | Admitting: Dermatology

## 2021-02-17 DIAGNOSIS — L820 Inflamed seborrheic keratosis: Secondary | ICD-10-CM | POA: Diagnosis not present

## 2021-02-17 DIAGNOSIS — I25118 Atherosclerotic heart disease of native coronary artery with other forms of angina pectoris: Secondary | ICD-10-CM | POA: Diagnosis not present

## 2021-02-17 DIAGNOSIS — L570 Actinic keratosis: Secondary | ICD-10-CM | POA: Diagnosis not present

## 2021-02-17 DIAGNOSIS — L578 Other skin changes due to chronic exposure to nonionizing radiation: Secondary | ICD-10-CM

## 2021-02-17 DIAGNOSIS — L821 Other seborrheic keratosis: Secondary | ICD-10-CM

## 2021-02-17 NOTE — Progress Notes (Signed)
Follow-Up Visit   Subjective  Sally Miller is a 87 y.o. female who presents for the following: Follow-up (Patient here today concerning several spots she noticed at forehead, left chest , and left eyebrow. ). The patient has spots, moles and lesions to be evaluated, some may be new or changing and the patient has concerns that these could be cancer.   The following portions of the chart were reviewed this encounter and updated as appropriate:  Tobacco  Allergies  Meds  Problems  Med Hx  Surg Hx  Fam Hx     Review of Systems: No other skin or systemic complaints except as noted in HPI or Assessment and Plan.  Objective  Well appearing patient in no apparent distress; mood and affect are within normal limits.  A focused examination was performed including chest, forehead, left eyebrow. Relevant physical exam findings are noted in the Assessment and Plan.  forehead x 11 (11) Erythematous thin papules/macules with gritty scale.   left mid brow x 1, left chest x 1 (2) Erythematous keratotic or waxy stuck-on papule or plaque.   Assessment & Plan  Actinic keratosis (11) forehead x 11  Actinic keratoses are precancerous spots that appear secondary to cumulative UV radiation exposure/sun exposure over time. They are chronic with expected duration over 1 year. A portion of actinic keratoses will progress to squamous cell carcinoma of the skin. It is not possible to reliably predict which spots will progress to skin cancer and so treatment is recommended to prevent development of skin cancer.  Recommend daily broad spectrum sunscreen SPF 30+ to sun-exposed areas, reapply every 2 hours as needed.  Recommend staying in the shade or wearing long sleeves, sun glasses (UVA+UVB protection) and wide brim hats (4-inch brim around the entire circumference of the hat). Call for new or changing lesions.  Will recheck at 3 month follow up   Destruction of lesion - forehead x 11 Complexity:  simple   Destruction method: cryotherapy   Informed consent: discussed and consent obtained   Timeout:  patient name, date of birth, surgical site, and procedure verified Lesion destroyed using liquid nitrogen: Yes   Region frozen until ice ball extended beyond lesion: Yes   Outcome: patient tolerated procedure well with no complications   Post-procedure details: wound care instructions given   Additional details:  Prior to procedure, discussed risks of blister formation, small wound, skin dyspigmentation, or rare scar following cryotherapy. Recommend Vaseline ointment to treated areas while healing.   Inflamed seborrheic keratosis left mid brow x 1, left chest x 1  Destruction of lesion - left mid brow x 1, left chest x 1 Complexity: simple   Destruction method: cryotherapy   Informed consent: discussed and consent obtained   Timeout:  patient name, date of birth, surgical site, and procedure verified Lesion destroyed using liquid nitrogen: Yes   Region frozen until ice ball extended beyond lesion: Yes   Outcome: patient tolerated procedure well with no complications   Post-procedure details: wound care instructions given   Additional details:  Prior to procedure, discussed risks of blister formation, small wound, skin dyspigmentation, or rare scar following cryotherapy. Recommend Vaseline ointment to treated areas while healing.   Seborrheic Keratoses - Stuck-on, waxy, tan-brown papules and/or plaques  - Benign-appearing - Discussed benign etiology and prognosis. - Observe - Call for any changes  Actinic Damage - chronic, secondary to cumulative UV radiation exposure/sun exposure over time - diffuse scaly erythematous macules with underlying dyspigmentation - Recommend  daily broad spectrum sunscreen SPF 30+ to sun-exposed areas, reapply every 2 hours as needed.  - Recommend staying in the shade or wearing long sleeves, sun glasses (UVA+UVB protection) and wide brim hats (4-inch  brim around the entire circumference of the hat). - Call for new or changing lesions.  Return for 3 month follow up on aks and isks . IRuthell Rummage, CMA, am acting as scribe for Sarina Ser, MD. Documentation: I have reviewed the above documentation for accuracy and completeness, and I agree with the above.  Sarina Ser, MD

## 2021-02-17 NOTE — Patient Instructions (Addendum)
Actinic keratoses are precancerous spots that appear secondary to cumulative UV radiation exposure/sun exposure over time. They are chronic with expected duration over 1 year. A portion of actinic keratoses will progress to squamous cell carcinoma of the skin. It is not possible to reliably predict which spots will progress to skin cancer and so treatment is recommended to prevent development of skin cancer.  Recommend daily broad spectrum sunscreen SPF 30+ to sun-exposed areas, reapply every 2 hours as needed.  Recommend staying in the shade or wearing long sleeves, sun glasses (UVA+UVB protection) and wide brim hats (4-inch brim around the entire circumference of the hat). Call for new or changing lesions.   Seborrheic Keratosis  What causes seborrheic keratoses? Seborrheic keratoses are harmless, common skin growths that first appear during adult life.  As time goes by, more growths appear.  Some people may develop a large number of them.  Seborrheic keratoses appear on both covered and uncovered body parts.  They are not caused by sunlight.  The tendency to develop seborrheic keratoses can be inherited.  They vary in color from skin-colored to gray, brown, or even black.  They can be either smooth or have a rough, warty surface.   Seborrheic keratoses are superficial and look as if they were stuck on the skin.  Under the microscope this type of keratosis looks like layers upon layers of skin.  That is why at times the top layer may seem to fall off, but the rest of the growth remains and re-grows.    Treatment Seborrheic keratoses do not need to be treated, but can easily be removed in the office.  Seborrheic keratoses often cause symptoms when they rub on clothing or jewelry.  Lesions can be in the way of shaving.  If they become inflamed, they can cause itching, soreness, or burning.  Removal of a seborrheic keratosis can be accomplished by freezing, burning, or surgery. If any spot bleeds,  scabs, or grows rapidly, please return to have it checked, as these can be an indication of a skin cancer.  Cryotherapy Aftercare  Wash gently with soap and water everyday.   Apply Vaseline and Band-Aid daily until healed.         If You Need Anything After Your Visit  If you have any questions or concerns for your doctor, please call our main line at [phone] and press option 4 to reach your doctor's medical assistant. If no one answers, please leave a voicemail as directed and we will return your call as soon as possible. Messages left after 4 pm will be answered the following business day.   You may also send [place] a message via [place]. We typically respond to MyChart messages within 1-2 business days.  For prescription refills, please ask your pharmacy to contact our office. Our fax number is [phone].  If you have an urgent issue when the clinic is closed that cannot wait until the next business day, you can page your doctor at the number below.    Please note that while we do our best to be available for urgent issues outside of office hours, we are not available 24/7.   If you have an urgent issue and are unable to reach [place], you may choose to seek medical care at your doctor's office, retail clinic, urgent care center, or emergency room.  If you have a medical emergency, please immediately call 911 or go to the emergency department.  Pager Numbers  - Dr. Nehemiah Massed: [phone]  -  Dr. Laurence Ferrari: [phone]  - Dr. Nicole Kindred: [phone]  In the event of inclement weather, please call our main line at [phone] for an update on the status of any delays or closures.  Dermatology Medication Tips: Please keep the boxes that topical medications come in in order to help keep track of the instructions about where and how to use these. Pharmacies typically print the medication instructions only on the boxes and not directly on the medication tubes.   If your medication is  too expensive, please contact our office at [phone] option 4 or send [place] a message through [place].   We are unable to tell what your co-pay for medications will be in advance as this is different depending on your insurance coverage. However, we may be able to find a substitute medication at lower cost or fill out paperwork to get insurance to cover a needed medication.   If a prior authorization is required to get your medication covered by your insurance company, please allow [place] 1-2 business days to complete this process.  Drug prices often vary depending on where the prescription is filled and some pharmacies may offer cheaper prices.  The website www.goodrx.com contains coupons for medications through different pharmacies. The prices here do not account for what the cost may be with help from insurance (it may be cheaper with your insurance), but the website can give you the price if you did not use any insurance.  - You can print the associated coupon and take it with your prescription to the pharmacy.  - You may also stop by our office during regular business hours and pick up a GoodRx coupon card.  - If you need your prescription sent electronically to a different pharmacy, notify our office through [facility] or by phone at [phone] option 4.     Si Usted Necesita Algo Despus de Su Visita  Tambin puede enviarnos un mensaje a travs de Pharmacist, community. Por lo general respondemos a los mensajes de MyChart en el transcurso de 1 a 2 das hbiles.  Para renovar recetas, por favor pida a su farmacia que se ponga en contacto con nuestra oficina. Harland Dingwall de fax es [place] [phone].  Si tiene un asunto urgente cuando la clnica est cerrada y que no puede esperar hasta el siguiente da hbil, puede llamar/localizar a su doctor(a) al nmero que aparece a continuacin.   Por favor, tenga en cuenta que aunque hacemos todo lo posible para estar disponibles para asuntos urgentes  fuera del horario de [place], no estamos disponibles las 24 horas del da, los 7 das de la [place].   Si tiene un problema urgente y no puede comunicarse con nosotros, puede optar por buscar atencin mdica  en el consultorio de su doctor(a), en una clnica privada, en un centro de atencin urgente o en una sala de emergencias.  Si tiene Engineering geologist, por favor llame inmediatamente al 911 o vaya a la sala de emergencias.  Nmeros de bper  - Dr. Nehemiah Massed: [phone]  - Dra. Moye: [phone]  - Dra. Nicole Kindred: [phone]  En caso de inclemencias del [place], por favor llame a Johnsie Kindred principal al [phone] para una actualizacin sobre el [place] de cualquier retraso o cierre.  Consejos para la medicacin en dermatologa: Por favor, guarde las cajas en las que vienen los medicamentos de uso tpico para ayudarle a seguir las instrucciones sobre dnde y cmo usarlos. Las farmacias generalmente imprimen las instrucciones del medicamento slo en las cajas y  no directamente en los tubos del medicamento.   Si su medicamento es muy caro, por favor, pngase en contacto con Zigmund Daniel llamando al [phone] y presione la opcin 4 o envenos un mensaje a travs de Pharmacist, community.   No podemos decirle cul ser su copago por los medicamentos por adelantado ya que esto es diferente dependiendo de la cobertura de su seguro. Sin embargo, es posible que podamos encontrar un medicamento sustituto a Electrical engineer un formulario para que el seguro cubra el medicamento que se considera necesario.   Si se requiere una autorizacin previa para que su compaa de seguros [place] su medicamento, por favor permtanos de 1 a 2 das hbiles para completar este proceso.  Los precios de los medicamentos varan con frecuencia dependiendo del Environmental consultant de dnde se surte la receta y alguna farmacias pueden ofrecer precios ms baratos.  El sitio web www.goodrx.com tiene cupones para medicamentos de  Airline pilot. Los precios aqu no tienen en cuenta lo que podra costar con la ayuda del seguro (puede ser ms barato con su seguro), pero el sitio web puede darle el precio si no utiliz Research scientist (physical sciences).  - Puede imprimir el cupn correspondiente y llevarlo con su receta a la farmacia.  - Tambin puede pasar por nuestra oficina durante el horario de atencin regular y Charity fundraiser una tarjeta de cupones de GoodRx.  - Si necesita que su receta se enve electrnicamente a una farmacia diferente, informe a nuestra oficina a travs de MyChart de [place] o por telfono llamando al [phone] y presione la opcin 4.

## 2021-02-27 ENCOUNTER — Encounter: Payer: Self-pay | Admitting: Dermatology

## 2021-05-19 ENCOUNTER — Encounter: Payer: Self-pay | Admitting: Dermatology

## 2021-05-19 ENCOUNTER — Ambulatory Visit (INDEPENDENT_AMBULATORY_CARE_PROVIDER_SITE_OTHER): Payer: Medicare Other | Admitting: Dermatology

## 2021-05-19 ENCOUNTER — Other Ambulatory Visit: Payer: Self-pay

## 2021-05-19 DIAGNOSIS — S90212A Contusion of left great toe with damage to nail, initial encounter: Secondary | ICD-10-CM | POA: Diagnosis not present

## 2021-05-19 DIAGNOSIS — L570 Actinic keratosis: Secondary | ICD-10-CM

## 2021-05-19 DIAGNOSIS — L578 Other skin changes due to chronic exposure to nonionizing radiation: Secondary | ICD-10-CM

## 2021-05-19 DIAGNOSIS — S90219A Contusion of unspecified great toe with damage to nail, initial encounter: Secondary | ICD-10-CM

## 2021-05-19 NOTE — Progress Notes (Signed)
? ?Follow-Up Visit ?  ?Subjective  ?Sally Miller is a 88 y.o. female who presents for the following: Actinic Keratosis (3 month follow up of forehead treated with LN2) and Other (Bruise? of left great toe). ?The patient has spots, moles and lesions to be evaluated, some may be new or changing and the patient has concerns that these could be cancer. ? ?Accompanied by daughter ? ?The following portions of the chart were reviewed this encounter and updated as appropriate:  ? Tobacco  Allergies  Meds  Problems  Med Hx  Surg Hx  Fam Hx   ?  ? ?Review of Systems:  No other skin or systemic complaints except as noted in HPI or Assessment and Plan. ? ?Objective  ?Well appearing patient in no apparent distress; mood and affect are within normal limits. ? ?A focused examination was performed including face, left foot. Relevant physical exam findings are noted in the Assessment and Plan. ? ?Forehead x 2, left nose x 1 (3) ?Erythematous thin papules/macules with gritty scale.  ? ?Left Hallux Toe Nail Plate ? ? ? ? ? ?Assessment & Plan  ?AK (actinic keratosis) (3) ?Forehead x 2, left nose x 1 ? ?Actinic Damage - Severe, confluent actinic changes with pre-cancerous actinic keratoses  ?- Severe, chronic, not at goal, secondary to cumulative UV radiation exposure over time ?- diffuse scaly erythematous macules and papules with underlying dyspigmentation ?- Discussed Prescription "Field Treatment" for Severe, Chronic Confluent Actinic Changes with Pre-Cancerous Actinic Keratoses ?Field treatment involves treatment of an entire area of skin that has confluent Actinic Changes (Sun/ Ultraviolet light damage) and PreCancerous Actinic Keratoses by method of PhotoDynamic Therapy (PDT) and/or prescription Topical Chemotherapy agents such as 5-fluorouracil, 5-fluorouracil/calcipotriene, and/or imiquimod.  The purpose is to decrease the number of clinically evident and subclinical PreCancerous lesions to prevent progression to  development of skin cancer by chemically destroying early precancer changes that may or may not be visible.  It has been shown to reduce the risk of developing skin cancer in the treated area. As a result of treatment, redness, scaling, crusting, and open sores may occur during treatment course. One or more than one of these methods may be used and may have to be used several times to control, suppress and eliminate the PreCancerous changes. Discussed treatment course, expected reaction, and possible side effects. ?- Recommend daily broad spectrum sunscreen SPF 30+ to sun-exposed areas, reapply every 2 hours as needed.  ?- Staying in the shade or wearing long sleeves, sun glasses (UVA+UVB protection) and wide brim hats (4-inch brim around the entire circumference of the hat) are also recommended. ?- Call for new or changing lesions. ? ?May consider field treatment in the future. ? ?Destruction of lesion - Forehead x 2, left nose x 1 ?Complexity: simple   ?Destruction method: cryotherapy   ?Informed consent: discussed and consent obtained   ?Timeout:  patient name, date of birth, surgical site, and procedure verified ?Lesion destroyed using liquid nitrogen: Yes   ?Region frozen until ice ball extended beyond lesion: Yes   ?Outcome: patient tolerated procedure well with no complications   ?Post-procedure details: wound care instructions given   ? ?Subungual hematoma of great toe ?Left Hallux Toe Nail Plate ?See photo ?Recheck on follow up ?Benign-appearing.  Observation.  Call clinic for new or changing lesions.  Recommend daily use of broad spectrum spf 30+ sunscreen to sun-exposed areas.  ? ?Return for 6-8 months follow up. ? ?I, Ashok Cordia, CMA, am acting as scribe  for Sarina Ser, MD . ?Documentation: I have reviewed the above documentation for accuracy and completeness, and I agree with the above. ? ?Sarina Ser, MD ? ?

## 2021-05-19 NOTE — Patient Instructions (Signed)

## 2021-07-14 ENCOUNTER — Other Ambulatory Visit: Payer: Self-pay | Admitting: Family Medicine

## 2021-07-14 DIAGNOSIS — R748 Abnormal levels of other serum enzymes: Secondary | ICD-10-CM

## 2021-07-22 ENCOUNTER — Ambulatory Visit
Admission: RE | Admit: 2021-07-22 | Discharge: 2021-07-22 | Disposition: A | Discharge status: Home / Self Care | Payer: Medicare Other | Source: Ambulatory Visit | Attending: Family Medicine | Admitting: Family Medicine

## 2021-07-22 DIAGNOSIS — R748 Abnormal levels of other serum enzymes: Secondary | ICD-10-CM | POA: Diagnosis present

## 2021-09-18 ENCOUNTER — Other Ambulatory Visit: Payer: Self-pay | Admitting: Cardiovascular Disease

## 2021-09-18 NOTE — Telephone Encounter (Signed)
Hi, could you please schedule a 12 month follow up visit? The patient was last seen by Dr. Rockey Situ on 09-03-20. Thank you so much.

## 2021-09-18 NOTE — Telephone Encounter (Signed)
Scheduled

## 2021-11-24 ENCOUNTER — Ambulatory Visit: Payer: Medicare Other | Admitting: Physician Assistant

## 2021-12-08 ENCOUNTER — Ambulatory Visit (INDEPENDENT_AMBULATORY_CARE_PROVIDER_SITE_OTHER): Payer: Medicare Other | Admitting: Dermatology

## 2021-12-08 DIAGNOSIS — L570 Actinic keratosis: Secondary | ICD-10-CM

## 2021-12-08 DIAGNOSIS — Z79899 Other long term (current) drug therapy: Secondary | ICD-10-CM | POA: Diagnosis not present

## 2021-12-08 DIAGNOSIS — L578 Other skin changes due to chronic exposure to nonionizing radiation: Secondary | ICD-10-CM | POA: Diagnosis not present

## 2021-12-08 DIAGNOSIS — Z5111 Encounter for antineoplastic chemotherapy: Secondary | ICD-10-CM

## 2021-12-08 NOTE — Progress Notes (Unsigned)
Follow-Up Visit   Subjective  Sally Miller is a 88 y.o. female who presents for the following: Actinic Keratosis (Still some redness at nose and still some spots at forehead. Pt is interested in starting the field treatment cream for spots. ). The patient has spots, moles and lesions to be evaluated, some may be new or changing and the patient has concerns that these could be cancer.  The following portions of the chart were reviewed this encounter and updated as appropriate:  Tobacco  Allergies  Meds  Problems  Med Hx  Surg Hx  Fam Hx     Review of Systems: No other skin or systemic complaints except as noted in HPI or Assessment and Plan.  Objective  Well appearing patient in no apparent distress; mood and affect are within normal limits.  A focused examination was performed including face, forehead, nose. Relevant physical exam findings are noted in the Assessment and Plan.  forehead and nose x 5 (5) Erythematous thin papules/macules with gritty scale.    Assessment & Plan  Actinic keratosis (5) forehead and nose x 5  Actinic keratoses are precancerous spots that appear secondary to cumulative UV radiation exposure/sun exposure over time. They are chronic with expected duration over 1 year. A portion of actinic keratoses will progress to squamous cell carcinoma of the skin. It is not possible to reliably predict which spots will progress to skin cancer and so treatment is recommended to prevent development of skin cancer.  Recommend daily broad spectrum sunscreen SPF 30+ to sun-exposed areas, reapply every 2 hours as needed.  Recommend staying in the shade or wearing long sleeves, sun glasses (UVA+UVB protection) and wide brim hats (4-inch brim around the entire circumference of the hat). Call for new or changing lesions.  Destruction of lesion - forehead and nose x 5 Complexity: simple   Destruction method: cryotherapy   Informed consent: discussed and consent  obtained   Timeout:  patient name, date of birth, surgical site, and procedure verified Lesion destroyed using liquid nitrogen: Yes   Region frozen until ice ball extended beyond lesion: Yes   Outcome: patient tolerated procedure well with no complications   Post-procedure details: wound care instructions given    Actinic Damage - Severe, confluent actinic changes with pre-cancerous actinic keratoses  - Severe, chronic, not at goal, secondary to cumulative UV radiation exposure over time - diffuse scaly erythematous macules and papules with underlying dyspigmentation - Discussed Prescription "Field Treatment" for Severe, Chronic Confluent Actinic Changes with Pre-Cancerous Actinic Keratoses Field treatment involves treatment of an entire area of skin that has confluent Actinic Changes (Sun/ Ultraviolet light damage) and PreCancerous Actinic Keratoses by method of PhotoDynamic Therapy (PDT) and/or prescription Topical Chemotherapy agents such as 5-fluorouracil, 5-fluorouracil/calcipotriene, and/or imiquimod.  The purpose is to decrease the number of clinically evident and subclinical PreCancerous lesions to prevent progression to development of skin cancer by chemically destroying early precancer changes that may or may not be visible.  It has been shown to reduce the risk of developing skin cancer in the treated area. As a result of treatment, redness, scaling, crusting, and open sores may occur during treatment course. One or more than one of these methods may be used and may have to be used several times to control, suppress and eliminate the PreCancerous changes. Discussed treatment course, expected reaction, and possible side effects. - Recommend daily broad spectrum sunscreen SPF 30+ to sun-exposed areas, reapply every 2 hours as needed.  - Staying in  the shade or wearing long sleeves, sun glasses (UVA+UVB protection) and wide brim hats (4-inch brim around the entire circumference of the hat) are  also recommended. - Call for new or changing lesions.  Start 1st of November  - Start 5-fluorouracil/calcipotriene cream twice a day for 7 days to affected areas including forehead and nose. Prescription sent to [facility]. Patient advised they will receive an email to purchase the medication online and have it sent to their home. Patient provided with handout reviewing treatment course and side effects and advised to call or message [place] on MyChart with any concerns.  Return in about 5 months (around 05/10/2022) for ak follow up . IRuthell Rummage, CMA, am acting as scribe for Sarina Ser, MD. Documentation: I have reviewed the above documentation for accuracy and completeness, and I agree with the above.  Sarina Ser, MD

## 2021-12-08 NOTE — Patient Instructions (Addendum)
Actinic keratoses are precancerous spots that appear secondary to cumulative UV radiation exposure/sun exposure over time. They are chronic with expected duration over 1 year. A portion of actinic keratoses will progress to squamous cell carcinoma of the skin. It is not possible to reliably predict which spots will progress to skin cancer and so treatment is recommended to prevent development of skin cancer.  Recommend daily broad spectrum sunscreen SPF 30+ to sun-exposed areas, reapply every 2 hours as needed.  Recommend staying in the shade or wearing long sleeves, sun glasses (UVA+UVB protection) and wide brim hats (4-inch brim around the entire circumference of the hat). Call for new or changing lesions.   Cryotherapy Aftercare  Wash gently with soap and water everyday.   Apply Vaseline and Band-Aid daily until healed.    November 1 st start - Start 5-fluorouracil/calcipotriene cream twice a day for 7 days to affected areas including upper forehead and nose . Prescription sent to [facility]. Patient advised they will receive an email to purchase the medication online and have it sent to their home. Patient provided with handout reviewing treatment course and side effects and advised to call or message [place] on MyChart with any concerns.  Instructions for [facility] Medications  One or more of your medications was sent to the [facility] order [facility]. You will receive an email from them and can purchase the medicine through that link. It will then be mailed to your home at the address you confirmed. If for any reason you do not receive an email from them, please check your spam folder. If you still do not find the email, please let us know. [facility] phone number is [phone].       5-Fluorouracil/Calcipotriene Patient Education   Actinic keratoses are the dry, red scaly spots on the skin caused by sun damage. A portion of these  spots can turn into skin cancer with time, and treating them can help prevent development of skin cancer.   Treatment of these spots requires removal of the defective skin cells. There are various ways to remove actinic keratoses, including freezing with liquid nitrogen, treatment with creams, or treatment with a blue light procedure in the office.   5-fluorouracil cream is a topical cream used to treat actinic keratoses. It works by interfering with the growth of abnormal fast-growing skin cells, such as actinic keratoses. These cells peel off and are replaced by healthy ones.   5-fluorouracil/calcipotriene is a combination of the 5-fluorouracil cream with a vitamin D analog cream called calcipotriene. The calcipotriene alone does not treat actinic keratoses. However, when it is combined with 5-fluorouracil, it helps the 5-fluorouracil treat the actinic keratoses much faster so that the same results can be achieved with a much shorter treatment time.  INSTRUCTIONS FOR 5-FLUOROURACIL/CALCIPOTRIENE CREAM:   5-fluorouracil/calcipotriene cream typically only needs to be used for 4-7 days. A thin layer should be applied twice a day to the treatment areas recommended by your physician.   If your physician prescribed you separate tubes of 5-fluourouracil and calcipotriene, apply a thin layer of 5-fluorouracil followed by a thin layer of calcipotriene.   Avoid contact with your eyes, nostrils, and mouth. Do not use 5-fluorouracil/calcipotriene cream on infected or open wounds.   You will develop redness, irritation and some crusting at areas where you have pre-cancer damage/actinic keratoses. IF YOU DEVELOP PAIN, BLEEDING, OR SIGNIFICANT CRUSTING, STOP THE TREATMENT EARLY - you have already gotten a good response and the  actinic keratoses should clear up well.  Wash your hands after applying 5-fluorouracil 5% cream on your skin.   A moisturizer or sunscreen with a minimum SPF 30 should be applied each  morning.   Once you have finished the treatment, you can apply a thin layer of Vaseline twice a day to irritated areas to soothe and calm the areas more quickly. If you experience significant discomfort, contact your physician.  For some patients it is necessary to repeat the treatment for best results.  SIDE EFFECTS: When using 5-fluorouracil/calcipotriene cream, you may have mild irritation, such as redness, dryness, swelling, or a mild burning sensation. This usually resolves within 2 weeks. The more actinic keratoses you have, the more redness and inflammation you can expect during treatment. Eye irritation has been reported rarely. If this occurs, please let us know.  If you have any trouble using this cream, please call the office. If you have any other questions about this information, please do not hesitate to ask me before you leave the office.   Due to recent changes in healthcare laws, you may see results of your pathology and/or laboratory studies on MyChart before the doctors have had a chance to review them. We understand that in some cases there may be results that are confusing or concerning to you. Please understand that not all results are received at the same time and often the doctors may need to interpret multiple results in order to provide you with the best plan of care or course of treatment. Therefore, we ask that you please give [place] 2 business days to thoroughly review all your results before contacting the office for clarification. Should we see a critical lab result, you will be contacted sooner.   If You Need Anything After Your Visit  If you have any questions or concerns for your doctor, please call our main line at [phone] and press option 4 to reach your doctor's medical assistant. If no one answers, please leave a voicemail as directed and we will return your call as soon as possible. Messages left after 4 pm will be answered the following business day.   You  may also send [place] a message via [place]. We typically respond to MyChart messages within 1-2 business days.  For prescription refills, please ask your pharmacy to contact our office. Our fax number is [phone].  If you have an urgent issue when the clinic is closed that cannot wait until the next business day, you can page your doctor at the number below.    Please note that while we do our best to be available for urgent issues outside of office hours, we are not available 24/7.   If you have an urgent issue and are unable to reach [place], you may choose to seek medical care at your doctor's office, retail clinic, urgent care center, or emergency room.  If you have a medical emergency, please immediately call 911 or go to the emergency department.  Pager Numbers  - Dr. Nehemiah Massed: [phone]  - Dr. Laurence Ferrari: [phone]  - Dr. Nicole Kindred: [phone]  In the event of inclement weather, please call our main line at [phone] for an update on the status of any delays or closures.  Dermatology Medication Tips: Please keep the boxes that topical medications come in in order to help keep track of the instructions about where and how to use these. Pharmacies typically print the medication instructions only on the boxes and not directly on the medication tubes.  If your medication is too expensive, please contact our office at [phone] option 4 or send [place] a message through [place].   We are unable to tell what your co-pay for medications will be in advance as this is different depending on your insurance coverage. However, we may be able to find a substitute medication at lower cost or fill out paperwork to get insurance to cover a needed medication.   If a prior authorization is required to get your medication covered by your insurance company, please allow [place] 1-2 business days to complete this process.  Drug prices often vary depending on where the prescription is filled and some  pharmacies may offer cheaper prices.  The website www.goodrx.com contains coupons for medications through different pharmacies. The prices here do not account for what the cost may be with help from insurance (it may be cheaper with your insurance), but the website can give you the price if you did not use any insurance.  - You can print the associated coupon and take it with your prescription to the pharmacy.  - You may also stop by our office during regular business hours and pick up a GoodRx coupon card.  - If you need your prescription sent electronically to a different pharmacy, notify our office through [facility] or by phone at [phone] option 4.     Si Usted Necesita Algo Despus de Su Visita  Tambin puede enviarnos un mensaje a travs de Pharmacist, community. Por lo general respondemos a los mensajes de MyChart en el transcurso de 1 a 2 das hbiles.  Para renovar recetas, por favor pida a su farmacia que se ponga en contacto con nuestra oficina. Harland Dingwall de fax es [place] [phone].  Si tiene un asunto urgente cuando la clnica est cerrada y que no puede esperar hasta el siguiente da hbil, puede llamar/localizar a su doctor(a) al nmero que aparece a continuacin.   Por favor, tenga en cuenta que aunque hacemos todo lo posible para estar disponibles para asuntos urgentes fuera del horario de [place], no estamos disponibles las 24 horas del da, los 7 das de la [place].   Si tiene un problema urgente y no puede comunicarse con nosotros, puede optar por buscar atencin mdica  en el consultorio de su doctor(a), en una clnica privada, en un centro de atencin urgente o en una sala de emergencias.  Si tiene Engineering geologist, por favor llame inmediatamente al 911 o vaya a la sala de emergencias.  Nmeros de bper  - Dr. Nehemiah Massed: [phone]  - Dra. Moye: [phone]  - Dra. Nicole Kindred: [phone]  En caso de inclemencias del [place], por favor llame a Johnsie Kindred  principal al [phone] para una actualizacin sobre el [place] de cualquier retraso o cierre.  Consejos para la medicacin en dermatologa: Por favor, guarde las cajas en las que vienen los medicamentos de uso tpico para ayudarle a seguir las instrucciones sobre dnde y cmo usarlos. Las farmacias generalmente imprimen las instrucciones del medicamento slo en las cajas y no directamente en los tubos del [place].   Si su medicamento es muy caro, por favor, pngase en contacto con Zigmund Daniel llamando al [phone] y presione la opcin 4 o envenos un mensaje a travs de Pharmacist, community.   No podemos decirle cul ser su copago por los medicamentos por adelantado ya que esto es diferente dependiendo de la cobertura de su seguro. Sin embargo, es posible que podamos encontrar un medicamento sustituto a Geneticist, molecular  formulario para que el seguro cubra el medicamento que se considera necesario.   Si se requiere una autorizacin previa para que su compaa de seguros [place] su medicamento, por favor permtanos de 1 a 2 das hbiles para completar este proceso.  Los precios de los medicamentos varan con frecuencia dependiendo del Environmental consultant de dnde se surte la receta y alguna farmacias pueden ofrecer precios ms baratos.  El sitio web www.goodrx.com tiene cupones para medicamentos de Airline pilot. Los precios aqu no tienen en cuenta lo que podra costar con la ayuda del seguro (puede ser ms barato con su seguro), pero el sitio web puede darle el precio si no utiliz Research scientist (physical sciences).  - Puede imprimir el cupn correspondiente y llevarlo con su receta a la farmacia.  - Tambin puede pasar por nuestra oficina durante el horario de atencin regular y Charity fundraiser una tarjeta de cupones de GoodRx.  - Si necesita que su receta se enve electrnicamente a una farmacia diferente, informe a nuestra oficina a travs de MyChart de [place] o por telfono llamando al [phone] y presione la opcin  4.

## 2021-12-09 ENCOUNTER — Encounter: Payer: Self-pay | Admitting: Dermatology

## 2021-12-23 ENCOUNTER — Other Ambulatory Visit: Payer: Self-pay | Admitting: Cardiovascular Disease

## 2021-12-30 ENCOUNTER — Other Ambulatory Visit: Payer: Self-pay | Admitting: Cardiovascular Disease

## 2022-01-18 NOTE — Progress Notes (Signed)
Cardiology Office Note  Date:  01/19/2022   ID:  Sally Miller, DOB January 13, 1931, MRN [id number]  PCP:  Kandyce Rud, MD   Chief Complaint  Patient presents with   12 month follow up     "Doing well." Medications reviewed by the patient verbally.     HPI:  88 yo woman with PMH of Coronary artery disease- s/p CABG 1996 native right coronary artery was chronically occluded, collaterals.  patent LIMA to LAD, SVG to d, SVG to OM.  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 on previous catheterization 2014 also stent to the iliac vessel/artery  Who presents for routine follow-up of her coronary artery disease  Last seen by myself in clinic June 2022 Followed by primary care, poor appetite Presents today with her daughter Weight is down over 2 pounds from prior clinic visit last year Reports poor appetite Does not like taste of boost or Ensure  Active, Goes shopping, no regular exercise Denies chest pain concerning for angina  Lab work reviewed Total cholesterol 114 LDL 53 GFR 48 creatinine 1.1 Potassium 3.4  Prior history of falls, none recently  EKG personally reviewed by myself on todays visit Normal sinus rhythm rate 75 bpm nonspecific T wave abnormality anterolateral leads  Prior history of hemaptysis, N/V Needed tranfusion Did not do EGD as she was on Plavix and declined procedure  Of past medical history reviewed Last seen in clinic October 2020 Previously seen in the emergency room August 15, 2018 for chest pain Was hypertensive at the time, Cardiac work-up negative  hospital February 2018 for chest pain in the setting of poorly controlled hypertension Previously declined cardiac catheterization, Also reported allergy to stress testing  PMH:   has a past medical history of CAD (coronary artery disease), HTN (hypertension), Hyperlipemia, MI (myocardial infarction) (HCC), and Syncope.  PSH:     Past Surgical History:  Procedure Laterality Date   CATARACT EXTRACTION, BILATERAL     CORONARY ANGIOPLASTY     CORONARY ARTERY BYPASS GRAFT  1996   RENAL ARTERY STENT      Current Outpatient Medications  Medication Sig Dispense Refill   ascorbic acid (VITAMIN C) 1000 MG tablet Take 1,000 mg by mouth daily.      aspirin 81 MG tablet Take 1 tablet (81 mg total) by mouth daily. 30 tablet 6   carboxymethylcellulose (REFRESH PLUS) 0.5 % SOLN Apply to eye. 1-2 drops into both eyes as needed for dry eyes     cholecalciferol (VITAMIN D3) 25 MCG (1000 UNIT) tablet Take 1,000 Units by mouth daily.     Cyanocobalamin (VITAMIN B 12) 100 MCG LOZG Take by mouth daily.     ezetimibe (ZETIA) 10 MG tablet Take 1 tablet by mouth once daily 90 tablet 0   feeding supplement, ENSURE ENLIVE, (ENSURE ENLIVE) LIQD Take 237 mLs by mouth 2 (two) times daily between meals. 237 mL 12   fluticasone (FLONASE) 50 MCG/ACT nasal spray Place 1 spray into both nostrils daily.     isosorbide mononitrate (IMDUR) 30 MG 24 hr tablet Take 1 tablet (30 mg total) by mouth daily. 90 tablet 3   latanoprost (XALATAN) 0.005 % ophthalmic solution Place 1 drop into both eyes at bedtime.     metoprolol tartrate (LOPRESSOR) 25 MG tablet Take 1 tablet (25 mg total) by mouth 2 (two) times daily. 180 tablet 3   nitroGLYCERIN (NITROSTAT) 0.4 MG SL tablet Place 1 tablet (0.4 mg total) under the  tongue every 5 (five) minutes as needed. 30 tablet 3   omeprazole (PRILOSEC) 40 MG capsule Take 1 capsule (40 mg total) by mouth 2 (two) times daily before a meal. 60 capsule 1   rosuvastatin (CRESTOR) 40 MG tablet Take 1 tablet (40 mg total) by mouth daily. 90 tablet 3   No current facility-administered medications for this visit.    Allergies:   Iodinated contrast media and Other   Social History:  The patient  reports that she has never smoked. She has never used smokeless tobacco. She reports that she does not drink alcohol and does not use  drugs.   Family History:   family history includes Coronary artery disease in her brother and sister; Heart attack in her brother and brother; Heart failure in her sister.   Review of Systems: Review of Systems  Constitutional: Negative.   Respiratory: Negative.    Cardiovascular: Negative.   Gastrointestinal: Negative.   Musculoskeletal: Negative.   Neurological: Negative.   Psychiatric/Behavioral: Negative.    All other systems reviewed and are negative.   PHYSICAL EXAM: VS:  BP (!) 140/60 (BP Location: Left Arm, Patient Position: Sitting, Cuff Size: Normal)   Pulse 75   Ht 5\' 1"  (1.549 m)   Wt 79 lb (35.8 kg)   SpO2 98%   BMI 14.93 kg/m  , BMI Body mass index is 14.93 kg/m. Constitutional:  oriented to person, place, and time. No distress.  Thin HENT:  Head: Grossly normal Eyes:  no discharge. No scleral icterus.  Neck: No JVD, no carotid bruits  Cardiovascular: Regular rate and rhythm, no murmurs appreciated Pulmonary/Chest: Clear to auscultation bilaterally, no wheezes or rails Abdominal: Soft.  no distension.  no tenderness.  Musculoskeletal: Normal range of motion Neurological:  normal muscle tone. Coordination normal. No atrophy Skin: Skin warm and dry Psychiatric: normal affect, pleasant    Recent Labs: No results found for requested labs within last 365 days.    Lipid Panel Lab Results  Component Value Date   CHOL 208 (H) 07/12/2014   HDL 76 07/12/2014   LDLCALC 115 (H) 07/12/2014   TRIG 86 07/12/2014    Wt Readings from Last 3 Encounters:  01/19/22 79 lb (35.8 kg)  09/03/20 81 lb 6 oz (36.9 kg)  07/25/20 82 lb 0.2 oz (37.2 kg)     ASSESSMENT AND PLAN:  Coronary artery disease of native artery of native heart with stable angina pectoris (HCC) Currently with no symptoms of angina. No further workup at this time. Continue current medication regimen.  Essential hypertension Blood pressure is well controlled on today's visit. No changes made to  the medications.  Mixed hyperlipidemia On Crestor , Zetia Cholesterol at goal  Hx of CABG Previously declined any testing Denies anginal symptoms, no further testing at this time  PAD Previous stenosis to the right renal artery on previous catheterization 2014 also stent to the iliac vessel/artery  Previously declined ultrasound Medical management, cholesterol at goal, denies claudication symptoms  GI bleed Followed by GI and primary care Prior vomiting of blood, no recent episodes Declined EGD, cardiac PPI    Total encounter time more than 30 minutes  Greater than 50% was spent in counseling and coordination of care with the patient   No orders of the defined types were placed in this encounter.    Signed, Dossie Arbour, M.D., Ph.D. 01/19/2022  [facility] [place], [place] [phone]

## 2022-01-19 ENCOUNTER — Ambulatory Visit: Payer: Medicare Other | Attending: Cardiovascular Disease | Admitting: Cardiovascular Disease

## 2022-01-19 ENCOUNTER — Encounter: Payer: Self-pay | Admitting: Cardiovascular Disease

## 2022-01-19 VITALS — BP 140/60 | HR 75 | Ht 61.0 in | Wt 79.0 lb

## 2022-01-19 DIAGNOSIS — I10 Essential (primary) hypertension: Secondary | ICD-10-CM | POA: Diagnosis not present

## 2022-01-19 DIAGNOSIS — I25118 Atherosclerotic heart disease of native coronary artery with other forms of angina pectoris: Secondary | ICD-10-CM | POA: Diagnosis not present

## 2022-01-19 DIAGNOSIS — R0602 Shortness of breath: Secondary | ICD-10-CM | POA: Diagnosis not present

## 2022-01-19 DIAGNOSIS — Z951 Presence of aortocoronary bypass graft: Secondary | ICD-10-CM

## 2022-01-19 DIAGNOSIS — E785 Hyperlipidemia, unspecified: Secondary | ICD-10-CM

## 2022-01-19 NOTE — Patient Instructions (Addendum)
Compression hose, leg elevation  Medication Instructions:  No changes  If you need a refill on your cardiac medications before your next appointment, please call your pharmacy.   Lab work: No new labs needed  Testing/Procedures: No new testing needed  Follow-Up: At [facility], you and your health needs are our priority.  As part of our continuing mission to provide you with exceptional heart care, we have created designated Provider Care Teams.  These Care Teams include your primary Cardiologist (physician) and Advanced Practice Providers (APPs -  Physician Assistants and Nurse Practitioners) who all work together to provide you with the care you need, when you need it.  You will need a follow up appointment in 6 months  Providers on your designated Care Team:   Murray Hodgkins, NP Christell Faith, PA-C Cadence Kathlen Mody, [place]  COVID-19 Vaccine Information can be found at: [email or web address] For questions related to vaccine distribution or appointments, please email [email or web address] or call [phone].

## 2022-03-21 ENCOUNTER — Other Ambulatory Visit: Payer: Self-pay | Admitting: Cardiovascular Disease

## 2022-05-05 ENCOUNTER — Ambulatory Visit: Payer: Medicare Other | Admitting: Dermatology

## 2022-07-18 NOTE — Progress Notes (Unsigned)
Cardiology Office Note  Date:  07/20/2022   ID:  Shantey Tock, DOB 28-Dec-1930, MRN [id number]  PCP:  Kandyce Rud, MD   Chief Complaint  Patient presents with   6 month follow up     Patient was in [place] with Covid & decrease in Potassium level. Medications reviewed by the patient verbally.     HPI:  89 yo woman with PMH of Coronary artery disease- s/p CABG 1996 native right coronary artery was chronically occluded, collaterals.  patent LIMA to LAD, SVG to d, SVG to OM.  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 on previous catheterization 2014 also stent to the iliac vessel/artery  Who presents for routine follow-up of her coronary artery disease, hx of CABG  Last seen by myself in clinic 11/23  Admission to hospital in [place] for weakness, low potassium, covid Potassium was repleted, was placed on MiraLAX for constipation per the daughter who presents with her today MiraLAX held for loose bowel movements During her hospital course Crestor and Zetia were held for elevated LFTs  Review of notes indicates elevated LFTs starting May 2022 At that time taking Crestor 40 daily  Without Crestor and Zetia total cholesterol 348,  LDL 259  Drinking supplement shake for weight gain Poor appetite Does not like taste of boost or Ensure  Active, Denies chest pain concerning for angina  Prior history of falls, none recently  EKG personally reviewed by myself on todays visit Normal sinus rhythm rate 87 bpm nonspecific T wave abnormality anterolateral leads  Other past medical history reviewed Prior history of hemaptysis, N/V Needed tranfusion Did not do EGD as she was on Plavix and declined procedure  Of past medical history reviewed Last seen in clinic October 2020 Previously seen in the emergency room August 15, 2018 for chest pain Was hypertensive at the time, Cardiac work-up negative  hospital February  2018 for chest pain in the setting of poorly controlled hypertension Previously declined cardiac catheterization, Also reported allergy to stress testing  PMH:   has a past medical history of CAD (coronary artery disease), HTN (hypertension), Hyperlipemia, MI (myocardial infarction) (HCC), and Syncope.  PSH:    Past Surgical History:  Procedure Laterality Date   CATARACT EXTRACTION, BILATERAL     CORONARY ANGIOPLASTY     CORONARY ARTERY BYPASS GRAFT  1996   RENAL ARTERY STENT      Current Outpatient Medications  Medication Sig Dispense Refill   aspirin 81 MG tablet Take 1 tablet (81 mg total) by mouth daily. 30 tablet 6   carboxymethylcellulose (REFRESH PLUS) 0.5 % SOLN Apply to eye. 1-2 drops into both eyes as needed for dry eyes     cholecalciferol (VITAMIN D3) 25 MCG (1000 UNIT) tablet Take 1,000 Units by mouth daily.     Cyanocobalamin (VITAMIN B 12) 100 MCG LOZG Take by mouth daily.     feeding supplement, ENSURE ENLIVE, (ENSURE ENLIVE) LIQD Take 237 mLs by mouth 2 (two) times daily between meals. 237 mL 12   fluticasone (FLONASE) 50 MCG/ACT nasal spray Place 1 spray into both nostrils daily.     isosorbide mononitrate (IMDUR) 30 MG 24 hr tablet Take 1 tablet (30 mg total) by mouth daily. 90 tablet 3   latanoprost (XALATAN) 0.005 % ophthalmic solution Place 1 drop into both eyes at bedtime.     metoprolol tartrate (LOPRESSOR) 25 MG tablet Take 25 mg by mouth daily.     nitroGLYCERIN (NITROSTAT) 0.4  MG SL tablet Place 1 tablet (0.4 mg total) under the tongue every 5 (five) minutes as needed. 30 tablet 3   No current facility-administered medications for this visit.    Allergies:   Iodinated contrast media and Other   Social History:  The patient  reports that she has never smoked. She has never used smokeless tobacco. She reports that she does not drink alcohol and does not use drugs.   Family History:   family history includes Coronary artery disease in her brother and sister;  Heart attack in her brother and brother; Heart failure in her sister.   Review of Systems: Review of Systems  Constitutional: Negative.   Respiratory: Negative.    Cardiovascular: Negative.   Gastrointestinal: Negative.   Musculoskeletal: Negative.   Neurological: Negative.   Psychiatric/Behavioral: Negative.    All other systems reviewed and are negative.   PHYSICAL EXAM: VS:  BP (!) 140/74 (BP Location: Left Arm, Patient Position: Sitting, Cuff Size: Normal)   Pulse 87   Ht 5\' 1"  (1.549 m)   Wt 82 lb (37.2 kg)   SpO2 98%   BMI 15.49 kg/m  , BMI Body mass index is 15.49 kg/m. Constitutional:  oriented to person, place, and time. No distress.  HENT:  Head: Grossly normal Eyes:  no discharge. No scleral icterus.  Neck: No JVD, no carotid bruits  Cardiovascular: Regular rate and rhythm, no murmurs appreciated Pulmonary/Chest: Clear to auscultation bilaterally, no wheezes or rails Abdominal: Soft.  no distension.  no tenderness.  Musculoskeletal: Normal range of motion Neurological:  normal muscle tone. Coordination normal. No atrophy Skin: Skin warm and dry Psychiatric: normal affect, pleasant  Recent Labs: No results found for requested labs within last 365 days.    Lipid Panel Lab Results  Component Value Date   CHOL 208 (H) 07/12/2014   HDL 76 07/12/2014   LDLCALC 115 (H) 07/12/2014   TRIG 86 07/12/2014    Wt Readings from Last 3 Encounters:  07/20/22 82 lb (37.2 kg)  01/19/22 79 lb (35.8 kg)  09/03/20 81 lb 6 oz (36.9 kg)     ASSESSMENT AND PLAN:  Coronary artery disease of native artery of native heart with stable angina pectoris (HCC) Currently with no symptoms of angina. No further workup at this time. Continue current medication regimen.  Essential hypertension Blood pressure is well controlled on today's visit. No changes made to the medications.  Mixed hyperlipidemia Crestor and Zetia held while she was hospitalized in [place] for COVID,  weakness, low potassium Cholesterol without medication markedly above goal She does not want PCSK9 inhibitor prefers to retry lower dose of the medication with close monitoring of liver function Recommend she start Crestor 10 with LFTs in 1 month If numbers could restart Zetia 10 mg daily with recheck of labs for potassium 1 month after that  Hx of CABG Currently with no symptoms of angina. No further workup at this time. Continue current medication regimen.  PAD Previous stenosis to the right renal artery on previous catheterization 2014 also stent to the iliac vessel/artery  Previously declined ultrasound Will continue to work on hyperlipidemia as above  GI bleed In the hospital May 2022 GI bleed Followed by GI and primary care Prior vomiting  blood, no recent episodes Declined EGD    Total encounter time more than 30 minutes  Greater than 50% was spent in counseling and coordination of care with the patient   Orders Placed This Encounter  Procedures   EKG  12-Lead     Signed, Dossie Arbour, M.D., Ph.D. 07/20/2022  [facility] [place], [place] [phone]

## 2022-07-20 ENCOUNTER — Ambulatory Visit: Payer: Medicare Other | Attending: Cardiovascular Disease | Admitting: Cardiovascular Disease

## 2022-07-20 ENCOUNTER — Encounter: Payer: Self-pay | Admitting: Cardiovascular Disease

## 2022-07-20 VITALS — BP 140/74 | HR 87 | Ht 61.0 in | Wt 82.0 lb

## 2022-07-20 DIAGNOSIS — Z79899 Other long term (current) drug therapy: Secondary | ICD-10-CM | POA: Insufficient documentation

## 2022-07-20 DIAGNOSIS — E785 Hyperlipidemia, unspecified: Secondary | ICD-10-CM | POA: Insufficient documentation

## 2022-07-20 DIAGNOSIS — I25118 Atherosclerotic heart disease of native coronary artery with other forms of angina pectoris: Secondary | ICD-10-CM

## 2022-07-20 DIAGNOSIS — R0602 Shortness of breath: Secondary | ICD-10-CM | POA: Insufficient documentation

## 2022-07-20 DIAGNOSIS — Z951 Presence of aortocoronary bypass graft: Secondary | ICD-10-CM | POA: Insufficient documentation

## 2022-07-20 DIAGNOSIS — I10 Essential (primary) hypertension: Secondary | ICD-10-CM | POA: Insufficient documentation

## 2022-07-20 MED ORDER — ROSUVASTATIN CALCIUM 10 MG PO TABS: 10.0000 mg | ORAL_TABLET | Freq: Every day | ORAL | 3 refills | Status: DC

## 2022-07-20 NOTE — Patient Instructions (Addendum)
Medication Instructions:  Restart crestor 10 mg daily  If lab work normal in one month, we could restart zetia 10 mg daily  If you need a refill on your cardiac medications before your next appointment, please call your pharmacy.   Lab work:  Your provider would like for you to return in 1 month to have the following labs drawn: CMP and Lipid Panel.   Please go to the [facility] entrance and check in at the front desk.  You do not need an appointment.  They are open from 7am-6 pm.  You will need to be fasting.   Testing/Procedures: No new testing needed  Follow-Up: At [facility], you and your health needs are our priority.  As part of our continuing mission to provide you with exceptional heart care, we have created designated Provider Care Teams.  These Care Teams include your primary Cardiologist (physician) and Advanced Practice Providers (APPs -  Physician Assistants and Nurse Practitioners) who all work together to provide you with the care you need, when you need it.  You will need a follow up appointment in 6 months  Providers on your designated Care Team:   Nicolasa Ducking, NP Eula Listen, PA-C Cadence Fransico Michael, [place]  COVID-19 Vaccine Information can be found at: [email or web address] For questions related to vaccine distribution or appointments, please email [email or web address] or call [phone].

## 2022-08-13 ENCOUNTER — Ambulatory Visit (INDEPENDENT_AMBULATORY_CARE_PROVIDER_SITE_OTHER): Payer: Medicare Other | Admitting: Dermatology

## 2022-08-13 VITALS — BP 136/86 | HR 69

## 2022-08-13 DIAGNOSIS — Z79899 Other long term (current) drug therapy: Secondary | ICD-10-CM

## 2022-08-13 DIAGNOSIS — X32XXXA Exposure to sunlight, initial encounter: Secondary | ICD-10-CM

## 2022-08-13 DIAGNOSIS — L578 Other skin changes due to chronic exposure to nonionizing radiation: Secondary | ICD-10-CM

## 2022-08-13 DIAGNOSIS — Z7189 Other specified counseling: Secondary | ICD-10-CM

## 2022-08-13 DIAGNOSIS — L820 Inflamed seborrheic keratosis: Secondary | ICD-10-CM | POA: Diagnosis not present

## 2022-08-13 DIAGNOSIS — W908XXA Exposure to other nonionizing radiation, initial encounter: Secondary | ICD-10-CM

## 2022-08-13 DIAGNOSIS — Z5111 Encounter for antineoplastic chemotherapy: Secondary | ICD-10-CM

## 2022-08-13 DIAGNOSIS — L821 Other seborrheic keratosis: Secondary | ICD-10-CM

## 2022-08-13 DIAGNOSIS — L570 Actinic keratosis: Secondary | ICD-10-CM

## 2022-08-13 NOTE — Progress Notes (Signed)
Follow-Up Visit   Subjective  Sally Miller is a 89 y.o. female who presents for the following: 8 months f/u on precancers she was prescribed 5FU/Calcipotriene cream at her last office visit but she never started that cream, The patient has spots, moles and lesions to be evaluated, some may be new or changing and the patient may have concern these could be cancer.   Daughter Sally Miller with patient   The following portions of the chart were reviewed this encounter and updated as appropriate: medications, allergies, medical history  Review of Systems:  No other skin or systemic complaints except as noted in HPI or Assessment and Plan.  Objective  Well appearing patient in no apparent distress; mood and affect are within normal limits.  A focused examination was performed of the following areas:face,neck, right foot    Relevant exam findings are noted in the Assessment and Plan.  left cheek x 1, right foot x 1 (2) Stuck-on, waxy, tan-brown papule --Discussed benign etiology and prognosis.   forehead x 1 Erythematous thin papules/macules with gritty scale.    Assessment & Plan   ACTINIC DAMAGE WITH PRECANCEROUS ACTINIC KERATOSES Counseling for Topical Chemotherapy Management: Patient exhibits: - Severe, confluent actinic changes with pre-cancerous actinic keratoses that is secondary to cumulative UV radiation exposure over time - Condition that is severe; chronic, not at goal. - diffuse scaly erythematous macules and papules with underlying dyspigmentation - Discussed Prescription "Field Treatment" topical Chemotherapy for Severe, Chronic Confluent Actinic Changes with Pre-Cancerous Actinic Keratoses Field treatment involves treatment of an entire area of skin that has confluent Actinic Changes (Sun/ Ultraviolet light damage) and PreCancerous Actinic Keratoses by method of PhotoDynamic Therapy (PDT) and/or prescription Topical Chemotherapy agents such as 5-fluorouracil,  5-fluorouracil/calcipotriene, and/or imiquimod.  The purpose is to decrease the number of clinically evident and subclinical PreCancerous lesions to prevent progression to development of skin cancer by chemically destroying early precancer changes that may or may not be visible.  It has been shown to reduce the risk of developing skin cancer in the treated area. As a result of treatment, redness, scaling, crusting, and open sores may occur during treatment course. One or more than one of these methods may be used and may have to be used several times to control, suppress and eliminate the PreCancerous changes. Discussed treatment course, expected reaction, and possible side effects. - Recommend daily broad spectrum sunscreen SPF 30+ to sun-exposed areas, reapply every 2 hours as needed.  - Staying in the shade or wearing long sleeves, sun glasses (UVA+UVB protection) and wide brim hats (4-inch brim around the entire circumference of the hat) are also recommended. - Call for new or changing lesions.  Start 5FU/Calcipotriene cream apply to the forehead twice a day x 7 days then stop [facility]   Inflamed seborrheic keratosis (2) left cheek x 1, right foot x 1  Symptomatic, irritating, patient would like treated.   Destruction of lesion - left cheek x 1, right foot x 1 Complexity: simple   Destruction method: cryotherapy   Informed consent: discussed and consent obtained   Timeout:  patient name, date of birth, surgical site, and procedure verified Lesion destroyed using liquid nitrogen: Yes   Region frozen until ice ball extended beyond lesion: Yes   Outcome: patient tolerated procedure well with no complications   Post-procedure details: wound care instructions given    AK (actinic keratosis) forehead x 1  Actinic keratoses are precancerous spots that appear secondary to  cumulative UV radiation exposure/sun exposure over time. They are chronic with expected  duration over 1 year. A portion of actinic keratoses will progress to squamous cell carcinoma of the skin. It is not possible to reliably predict which spots will progress to skin cancer and so treatment is recommended to prevent development of skin cancer.  Recommend daily broad spectrum sunscreen SPF 30+ to sun-exposed areas, reapply every 2 hours as needed.  Recommend staying in the shade or wearing long sleeves, sun glasses (UVA+UVB protection) and wide brim hats (4-inch brim around the entire circumference of the hat). Call for new or changing lesions.   Destruction of lesion - forehead x 1 Complexity: simple   Destruction method: cryotherapy   Informed consent: discussed and consent obtained   Timeout:  patient name, date of birth, surgical site, and procedure verified Lesion destroyed using liquid nitrogen: Yes   Region frozen until ice ball extended beyond lesion: Yes   Outcome: patient tolerated procedure well with no complications   Post-procedure details: wound care instructions given    SEBORRHEIC KERATOSIS - Stuck-on, waxy, tan-brown papules and/or plaques  - Benign-appearing - Discussed benign etiology and prognosis. - Observe - Call for any changes  Return in about 8 months (around 04/15/2023) for Aks .  IAngelique Holm, CMA, am acting as scribe for Armida Sans, MD .   Documentation: I have reviewed the above documentation for accuracy and completeness, and I agree with the above.  Armida Sans, MD

## 2022-08-13 NOTE — Patient Instructions (Addendum)
Instructions for [facility] Medications  One or more of your medications was sent to the [facility] mail order compounding pharmacy. You will receive an email from them and can purchase the medicine through that link. It will then be mailed to your home at the address you confirmed. If for any reason you do not receive an email from them, please check your spam folder. If you still do not find the email, please let us know. [facility] phone number is [phone].    Start 5FU/Calcipotriene cream apply forehead twice a day x 7 days then stop    5-Fluorouracil/Calcipotriene Patient Education   Actinic keratoses are the dry, red scaly spots on the skin caused by sun damage. A portion of these spots can turn into skin cancer with time, and treating them can help prevent development of skin cancer.   Treatment of these spots requires removal of the defective skin cells. There are various ways to remove actinic keratoses, including freezing with liquid nitrogen, treatment with creams, or treatment with a blue light procedure in the office.   5-fluorouracil cream is a topical cream used to treat actinic keratoses. It works by interfering with the growth of abnormal fast-growing skin cells, such as actinic keratoses. These cells peel off and are replaced by healthy ones.   5-fluorouracil/calcipotriene is a combination of the 5-fluorouracil cream with a vitamin D analog cream called calcipotriene. The calcipotriene alone does not treat actinic keratoses. However, when it is combined with 5-fluorouracil, it helps the 5-fluorouracil treat the actinic keratoses much faster so that the same results can be achieved with a much shorter treatment time.  INSTRUCTIONS FOR 5-FLUOROURACIL/CALCIPOTRIENE CREAM:   5-fluorouracil/calcipotriene cream typically only needs to be used for 4-7 days. A thin layer should be applied twice a day to the treatment areas recommended by your physician.   If your  physician prescribed you separate tubes of 5-fluourouracil and calcipotriene, apply a thin layer of 5-fluorouracil followed by a thin layer of calcipotriene.   Avoid contact with your eyes, nostrils, and mouth. Do not use 5-fluorouracil/calcipotriene cream on infected or open wounds.   You will develop redness, irritation and some crusting at areas where you have pre-cancer damage/actinic keratoses. IF YOU DEVELOP PAIN, BLEEDING, OR SIGNIFICANT CRUSTING, STOP THE TREATMENT EARLY - you have already gotten a good response and the actinic keratoses should clear up well.  Wash your hands after applying 5-fluorouracil 5% cream on your skin.   A moisturizer or sunscreen with a minimum SPF 30 should be applied each morning.   Once you have finished the treatment, you can apply a thin layer of Vaseline twice a day to irritated areas to soothe and calm the areas more quickly. If you experience significant discomfort, contact your physician.  For some patients it is necessary to repeat the treatment for best results.  SIDE EFFECTS: When using 5-fluorouracil/calcipotriene cream, you may have mild irritation, such as redness, dryness, swelling, or a mild burning sensation. This usually resolves within 2 weeks. The more actinic keratoses you have, the more redness and inflammation you can expect during treatment. Eye irritation has been reported rarely. If this occurs, please let us know.  If you have any trouble using this cream, please call the office. If you have any other questions about this information, please do not hesitate to ask me before you leave the office.    Cryotherapy Aftercare  Wash gently with soap and water everyday.   Apply Vaseline and Band-Aid daily  until healed.      Due to recent changes in healthcare laws, you may see results of your pathology and/or laboratory studies on MyChart before the doctors have had a chance to review them. We understand that in some cases there may be  results that are confusing or concerning to you. Please understand that not all results are received at the same time and often the doctors may need to interpret multiple results in order to provide you with the best plan of care or course of treatment. Therefore, we ask that you please give [place] 2 business days to thoroughly review all your results before contacting the office for clarification. Should we see a critical lab result, you will be contacted sooner.   If You Need Anything After Your Visit  If you have any questions or concerns for your doctor, please call our main line at [phone] and press option 4 to reach your doctor's medical assistant. If no one answers, please leave a voicemail as directed and we will return your call as soon as possible. Messages left after 4 pm will be answered the following business day.   You may also send [place] a message via MyChart. We typically respond to MyChart messages within 1-2 business days.  For prescription refills, please ask your pharmacy to contact our office. Our fax number is [phone].  If you have an urgent issue when the clinic is closed that cannot wait until the next business day, you can page your doctor at the number below.    Please note that while we do our best to be available for urgent issues outside of office hours, we are not available 24/7.   If you have an urgent issue and are unable to reach [place], you may choose to seek medical care at your doctor's office, retail clinic, urgent care center, or emergency room.  If you have a medical emergency, please immediately call 911 or go to the emergency department.  Pager Numbers  - Dr. Gwen Pounds: [phone]  - Dr. Neale Burly: [phone]  - Dr. Roseanne Reno: [phone]  In the event of inclement weather, please call our main line at [phone] for an update on the status of any delays or closures.  Dermatology Medication Tips: Please keep the boxes that topical medications come  in in order to help keep track of the instructions about where and how to use these. Pharmacies typically print the medication instructions only on the boxes and not directly on the medication tubes.   If your medication is too expensive, please contact our office at [phone] option 4 or send [place] a message through MyChart.   We are unable to tell what your co-pay for medications will be in advance as this is different depending on your insurance coverage. However, we may be able to find a substitute medication at lower cost or fill out paperwork to get insurance to cover a needed medication.   If a prior authorization is required to get your medication covered by your insurance company, please allow [place] 1-2 business days to complete this process.  Drug prices often vary depending on where the prescription is filled and some pharmacies may offer cheaper prices.  The website www.goodrx.com contains coupons for medications through different pharmacies. The prices here do not account for what the cost may be with help from insurance (it may be cheaper with your insurance), but the website can give you the price if you did not use any insurance.  - You can  print the associated coupon and take it with your prescription to the pharmacy.  - You may also stop by our office during regular business hours and pick up a GoodRx coupon card.  - If you need your prescription sent electronically to a different pharmacy, notify our office through [facility] or by phone at [phone] option 4.     Si Usted Necesita Algo Despus de Su Visita  Tambin puede enviarnos un mensaje a travs de Clinical cytogeneticist. Por lo general respondemos a los mensajes de MyChart en el transcurso de 1 a 2 das hbiles.  Para renovar recetas, por favor pida a su farmacia que se ponga en contacto con nuestra oficina. Annie Sable de fax es [place] [phone].  Si tiene un asunto urgente cuando la clnica est cerrada y que no puede  esperar hasta el siguiente da hbil, puede llamar/localizar a su doctor(a) al nmero que aparece a continuacin.   Por favor, tenga en cuenta que aunque hacemos todo lo posible para estar disponibles para asuntos urgentes fuera del horario de [place], no estamos disponibles las 24 horas del da, los 7 [address]  [address] de la [place].   Si tiene un problema urgente y no puede comunicarse con nosotros, puede optar por buscar atencin mdica  en el consultorio de su doctor(a), en una clnica privada, en un centro de atencin urgente o en una sala de emergencias.  Si tiene Engineer, drilling, por favor llame inmediatamente al 911 o vaya a la sala de emergencias.  Nmeros de bper  - Dr. Gwen Pounds: [phone]  - Dra. Moye: [phone]  - Dra. Roseanne Reno: [phone]  En caso de inclemencias del [place], por favor llame a Lacy Duverney principal al [phone] para una actualizacin sobre el [place] de cualquier retraso o cierre.  Consejos para la medicacin en dermatologa: Por favor, guarde las cajas en las que vienen los medicamentos de uso tpico para ayudarle a seguir las instrucciones sobre dnde y cmo usarlos. Las farmacias generalmente imprimen las instrucciones del medicamento slo en las cajas y no directamente en los tubos del [place].   Si su medicamento es muy caro, por favor, pngase en contacto con Rolm Gala llamando al [phone] y presione la opcin 4 o envenos un mensaje a travs de Clinical cytogeneticist.   No podemos decirle cul ser su copago por los medicamentos por adelantado ya que esto es diferente dependiendo de la cobertura de su seguro. Sin embargo, es posible que podamos encontrar un medicamento sustituto a Audiological scientist un formulario para que el seguro cubra el medicamento que se considera necesario.   Si se requiere una autorizacin previa para que su compaa de seguros [place] su medicamento, por favor permtanos de 1 a 2 das hbiles para completar [address]  Los  precios de los medicamentos varan con frecuencia dependiendo del Environmental consultant de dnde se surte la receta y alguna farmacias pueden ofrecer precios ms baratos.  El sitio web www.goodrx.com tiene cupones para medicamentos de Health and safety inspector. Los precios aqu no tienen en cuenta lo que podra costar con la ayuda del seguro (puede ser ms barato con su seguro), pero el sitio web puede darle el precio si no utiliz Tourist information centre manager.  - Puede imprimir el cupn correspondiente y llevarlo con su receta a la farmacia.  - Tambin puede pasar por nuestra oficina durante el horario de atencin regular y Education officer, museum una tarjeta de cupones de GoodRx.  - Si necesita que su receta se enve electrnicamente a una farmacia diferente, informe a [place]  oficina a travs de MyChart de [place] o por telfono llamando al [phone] y presione la opcin 4.

## 2022-08-25 ENCOUNTER — Encounter: Payer: Self-pay | Admitting: Dermatology

## 2023-04-15 ENCOUNTER — Ambulatory Visit: Payer: Medicare Other | Admitting: Dermatology

## 2023-05-17 ENCOUNTER — Ambulatory Visit: Payer: Medicare Other | Admitting: Dermatology

## 2023-07-26 ENCOUNTER — Other Ambulatory Visit: Payer: Self-pay | Admitting: Cardiovascular Disease

## 2023-07-26 NOTE — Telephone Encounter (Signed)
 I called pt and left a voice message to call and schedule  a medfill follow up appointment

## 2023-07-26 NOTE — Telephone Encounter (Signed)
 Good Morning,  Could someone schedule this patient an overdue 6 month follow up visit? The patient was last seen by Dr. Gollan on 07-20-22. Thank you so much.

## 2023-07-27 ENCOUNTER — Telehealth: Payer: Self-pay | Admitting: Cardiovascular Disease

## 2023-07-27 NOTE — Telephone Encounter (Signed)
 Left voicemail, pt needs appt scheduled for med refill

## 2023-08-02 NOTE — Telephone Encounter (Signed)
 Left voice mail

## 2023-08-03 ENCOUNTER — Encounter: Payer: Self-pay | Admitting: Cardiovascular Disease

## 2023-08-03 NOTE — Telephone Encounter (Signed)
 Called 3x. Unable to reach letter sent via mail.

## 2023-08-03 NOTE — Telephone Encounter (Signed)
 Left voicemail. Called 3x, Unable to reach letter being sent via mail.

## 2023-08-03 NOTE — Telephone Encounter (Signed)
 Called 3x, unable to reach letter sent  via mail

## 2023-08-31 ENCOUNTER — Other Ambulatory Visit: Payer: Self-pay | Admitting: Cardiovascular Disease

## 2023-09-12 NOTE — Progress Notes (Unsigned)
 Cardiology Office Note  Date:  09/13/2023   ID:  Sally Miller, DOB March 06, 1931, MRN [id number]  PCP:  Diedra Lame, MD   Chief Complaint  Patient presents with   6 month follow up     Doing well.     HPI:  89 yo woman with PMH of Coronary artery disease- s/p CABG 1996 native right coronary artery was chronically occluded, collaterals.  patent LIMA to LAD, SVG to d, SVG to OM.  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 on previous catheterization 2014 also stent to the iliac vessel/artery  Who presents for routine follow-up of her coronary artery disease, hx of CABG, PAD  Last seen by myself in clinic 5/24 In follow-up today reports feeling well Active, has a treadmill at home Denies any chest pain concerning for angina Reports that she is taking Zetia  but this is not on her medication list Crestor  10 Lipids above goal  No recent falls  Review of notes indicates elevated LFTs starting May 2022 At that time taking Crestor  40 daily  Without Crestor  and Zetia  total cholesterol 348,  LDL 259  EKG personally reviewed by myself on todays visit EKG Interpretation Date/Time:  Monday September 13 2023 11:49:17 EDT Ventricular Rate:  81 PR Interval:  166 QRS Duration:  78 QT Interval:  378 QTC Calculation: 439 R Axis:   27  Text Interpretation: Normal sinus rhythm with sinus arrhythmia When compared with ECG of 26-Jul-2020 01:24, No significant change was found Confirmed by Perla Lye [id number]) on 09/13/2023 12:09:04 PM   Other past medical history reviewed Prior history of hemaptysis, N/V Needed tranfusion Did not do EGD as she was on Plavix  and declined procedure  Of past medical history reviewed Last seen in clinic October 2020 Previously seen in the emergency room August 15, 2018 for chest pain Was hypertensive at the time, Cardiac work-up negative  hospital February 2018 for chest pain in the setting  of poorly controlled hypertension Previously declined cardiac catheterization, Also reported allergy to stress testing  PMH:   has a past medical history of CAD (coronary artery disease), HTN (hypertension), Hyperlipemia, MI (myocardial infarction) (HCC), and Syncope.  PSH:    Past Surgical History:  Procedure Laterality Date   CATARACT EXTRACTION, BILATERAL     CORONARY ANGIOPLASTY     CORONARY ARTERY BYPASS GRAFT  1996   RENAL ARTERY STENT      Current Outpatient Medications  Medication Sig Dispense Refill   aspirin  81 MG tablet Take 1 tablet (81 mg total) by mouth daily. 30 tablet 6   carboxymethylcellulose (REFRESH PLUS) 0.5 % SOLN Apply to eye. 1-2 drops into both eyes as needed for dry eyes     cholecalciferol (VITAMIN D3) 25 MCG (1000 UNIT) tablet Take 1,000 Units by mouth daily.     Cyanocobalamin (VITAMIN B 12) 100 MCG LOZG Take by mouth daily.     ezetimibe  (ZETIA ) 10 MG tablet Take 10 mg by mouth daily.     feeding supplement, ENSURE ENLIVE, (ENSURE ENLIVE) LIQD Take 237 mLs by mouth 2 (two) times daily between meals. 237 mL 12   isosorbide  mononitrate (IMDUR ) 30 MG 24 hr tablet Take 1 tablet (30 mg total) by mouth daily. 90 tablet 3   latanoprost (XALATAN) 0.005 % ophthalmic solution Place 1 drop into both eyes at bedtime.     metoprolol  tartrate (LOPRESSOR ) 25 MG tablet Take 25 mg by mouth daily.     nitroGLYCERIN  (NITROSTAT )  0.4 MG SL tablet Place 1 tablet (0.4 mg total) under the tongue every 5 (five) minutes as needed. 30 tablet 3   rosuvastatin  (CRESTOR ) 10 MG tablet Take 1 tablet (10 mg total) by mouth daily. PLEASE KEEP UPCOMING APPOINTMENT IN ORDER TO RECEIVE FUTURE REFILLS, THANK YOU! 30 tablet 1   Zeaxanthin POWD Take 1 capsule by mouth daily.     fluticasone  (FLONASE ) 50 MCG/ACT nasal spray Place 1 spray into both nostrils daily. (Patient not taking: Reported on 09/13/2023)     No current facility-administered medications for this visit.    Allergies:    Iodinated contrast media and Other   Social History:  The patient  reports that she has never smoked. She has never used smokeless tobacco. She reports that she does not drink alcohol and does not use drugs.   Family History:   family history includes Coronary artery disease in her brother and sister; Heart attack in her brother and brother; Heart failure in her sister.   Review of Systems: Review of Systems  Constitutional: Negative.   Respiratory: Negative.    Cardiovascular: Negative.   Gastrointestinal: Negative.   Musculoskeletal: Negative.   Neurological: Negative.   Psychiatric/Behavioral: Negative.    All other systems reviewed and are negative.   PHYSICAL EXAM: VS:  BP 130/78 (BP Location: Left Arm, Patient Position: Sitting, Cuff Size: Normal)   Pulse 81   Ht 5' 1 (1.549 m)   Wt 85 lb 6 oz (38.7 kg)   SpO2 98%   BMI 16.13 kg/m  , BMI Body mass index is 16.13 kg/m. Constitutional:  oriented to person, place, and time. No distress.  HENT:  Head: Grossly normal Eyes:  no discharge. No scleral icterus.  Neck: No JVD, no carotid bruits  Cardiovascular: Regular rate and rhythm, no murmurs appreciated Pulmonary/Chest: Clear to auscultation bilaterally, no wheezes or rales Abdominal: Soft.  no distension.  no tenderness.  Musculoskeletal: Normal range of motion Neurological:  normal muscle tone. Coordination normal. No atrophy Skin: Skin warm and dry Psychiatric: normal affect, pleasant  Recent Labs: No results found for requested labs within last 365 days.   Lipid Panel Lab Results  Component Value Date   CHOL 208 (H) 07/12/2014   HDL 76 07/12/2014   LDLCALC 115 (H) 07/12/2014   TRIG 86 07/12/2014    Wt Readings from Last 3 Encounters:  09/13/23 85 lb 6 oz (38.7 kg)  07/20/22 82 lb (37.2 kg)  01/19/22 79 lb (35.8 kg)    ASSESSMENT AND PLAN:  Coronary artery disease of native artery of native heart with stable angina pectoris (HCC) Currently with no  symptoms of angina. No further workup at this time. Continue current medication regimen.  Essential hypertension Blood pressure is well controlled on today's visit. No changes made to the medications.  Mixed hyperlipidemia Crestor  and Zetia  held while she was hospitalized in [place]  for COVID, weakness, low potassium Cholesterol without medication markedly above goal She does not want PCSK9 inhibitor Recommend she stay on Zetia  10 daily Crestor  up to 20 Lipids and LFTs in 3 months time  Hx of CABG Denies anginal symptoms  PAD Previous stenosis to the right renal artery on previous catheterization 2014 also stent to the iliac vessel/artery  Previously declined ultrasound Stressed importance of aggressive lipid control  GI bleed In the hospital May 2022 GI bleed Followed by GI and primary care Prior vomiting  blood, no recent episodes Declined EGD   Orders Placed This Encounter  Procedures  EKG 12-Lead     Signed, Velinda Lunger, M.D., Ph.D. 09/13/2023  [facility] [place], [place] [phone]

## 2023-09-13 ENCOUNTER — Encounter: Payer: Self-pay | Admitting: Cardiovascular Disease

## 2023-09-13 ENCOUNTER — Telehealth: Payer: Self-pay | Admitting: Emergency Medicine

## 2023-09-13 ENCOUNTER — Ambulatory Visit: Attending: Cardiovascular Disease | Admitting: Cardiovascular Disease

## 2023-09-13 VITALS — BP 130/78 | HR 81 | Ht 61.0 in | Wt 85.4 lb

## 2023-09-13 DIAGNOSIS — E785 Hyperlipidemia, unspecified: Secondary | ICD-10-CM | POA: Insufficient documentation

## 2023-09-13 DIAGNOSIS — Z951 Presence of aortocoronary bypass graft: Secondary | ICD-10-CM | POA: Diagnosis present

## 2023-09-13 DIAGNOSIS — I10 Essential (primary) hypertension: Secondary | ICD-10-CM | POA: Insufficient documentation

## 2023-09-13 DIAGNOSIS — I25118 Atherosclerotic heart disease of native coronary artery with other forms of angina pectoris: Secondary | ICD-10-CM | POA: Diagnosis not present

## 2023-09-13 DIAGNOSIS — Z79899 Other long term (current) drug therapy: Secondary | ICD-10-CM

## 2023-09-13 DIAGNOSIS — R0602 Shortness of breath: Secondary | ICD-10-CM | POA: Insufficient documentation

## 2023-09-13 MED ORDER — ROSUVASTATIN CALCIUM 20 MG PO TABS: 20.0000 mg | ORAL_TABLET | Freq: Every day | ORAL | 3 refills | Status: AC

## 2023-09-13 MED ORDER — METOPROLOL TARTRATE 25 MG PO TABS: 25.0000 mg | ORAL_TABLET | Freq: Every day | ORAL | 3 refills | Status: AC

## 2023-09-13 MED ORDER — ISOSORBIDE MONONITRATE ER 30 MG PO TB24: 30.0000 mg | ORAL_TABLET | Freq: Every day | ORAL | 3 refills | Status: AC

## 2023-09-13 MED ORDER — EZETIMIBE 10 MG PO TABS: 10.0000 mg | ORAL_TABLET | Freq: Every day | ORAL | 3 refills | Status: AC

## 2023-09-13 NOTE — Patient Instructions (Addendum)
 Medication Instructions:   Please increase the crestor /rosuvastatin  up to 20 mg daily  If you need a refill on your cardiac medications before your next appointment, please call your pharmacy.   Lab work: No new labs needed  Testing/Procedures: No new testing needed  Follow-Up: At [facility], you and your health needs are our priority.  As part of our continuing mission to provide you with exceptional heart care, we have created designated Provider Care Teams.  These Care Teams include your primary Cardiologist (physician) and Advanced Practice Providers (APPs -  Physician Assistants and Nurse Practitioners) who all work together to provide you with the care you need, when you need it.  You will need a follow up appointment in 12 months  Providers on your designated Care Team:   Lonni Meager, NP Bernardino Bring, PA-C Cadence [place], [place]  COVID-19 Vaccine Information can be found at: [email or web address] For questions related to vaccine distribution or appointments, please email [email or web address] or call [phone].

## 2023-09-13 NOTE — Telephone Encounter (Signed)
 Called patient and left a detailed message of the following from Dr. Gollan.  Seen this morning, I went up on Crestor  from 10 to 20.  Can we recommend to her a LFTs and lipid panel in 3 months

## 2023-09-18 IMAGING — US US ABDOMEN LIMITED
1 series · 14 of 25 positions shown · non-contrast
Comparison: None Available.

CLINICAL DATA: Elevated LFTs

EXAM:
ULTRASOUND ABDOMEN LIMITED RIGHT UPPER QUADRANT

[Series 1: us abdomen limited · 0.19mm/px · 49 acquisitions, 14 frames shown]
[im 1/49]
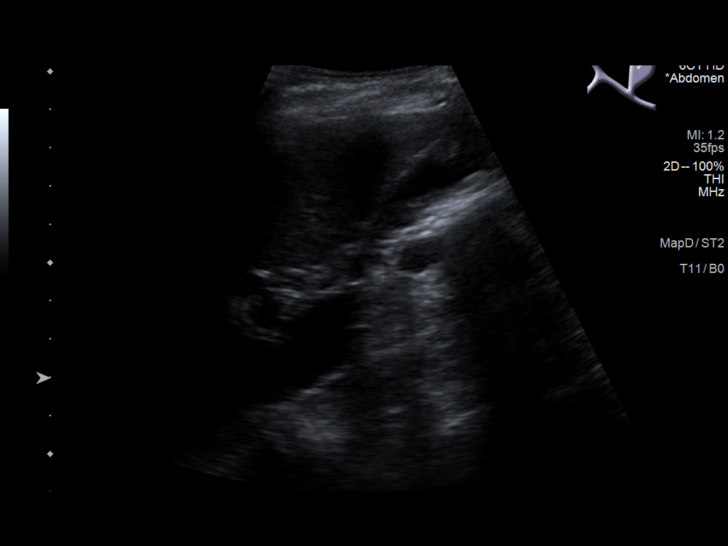
[im 5/49]
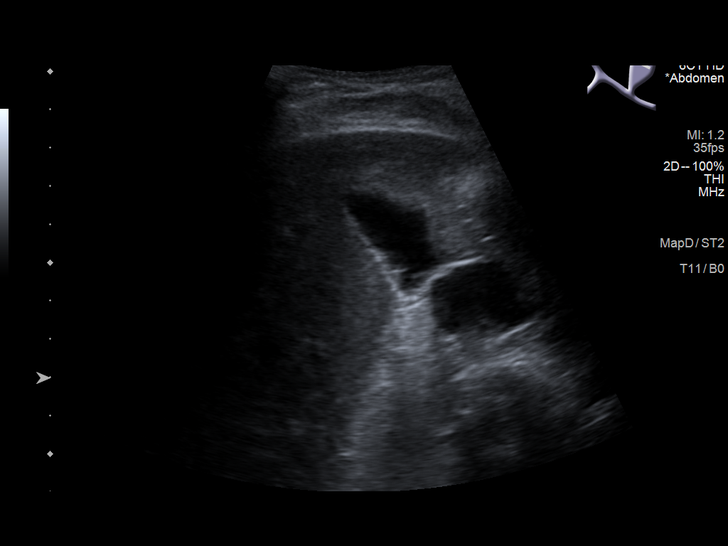
[im 9/49]
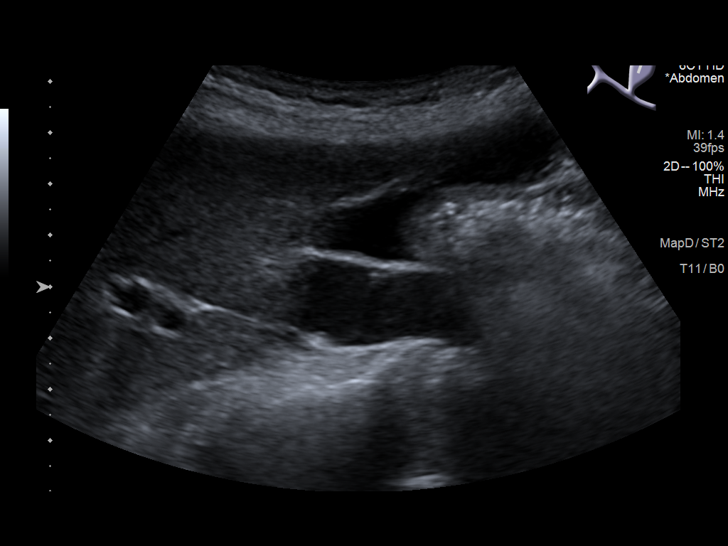
[im 13/49]
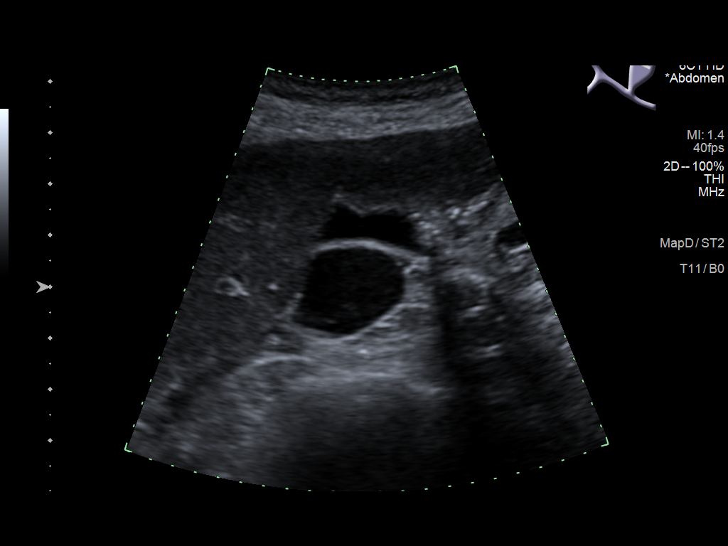
[im 17/49]
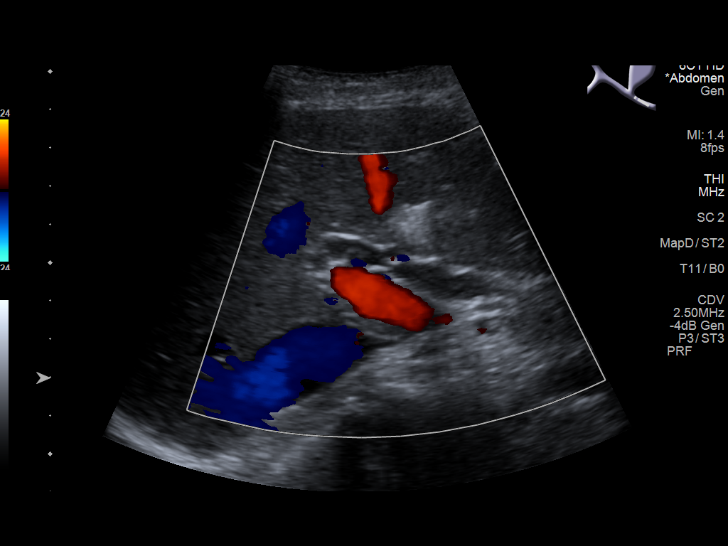
[im 19/49]
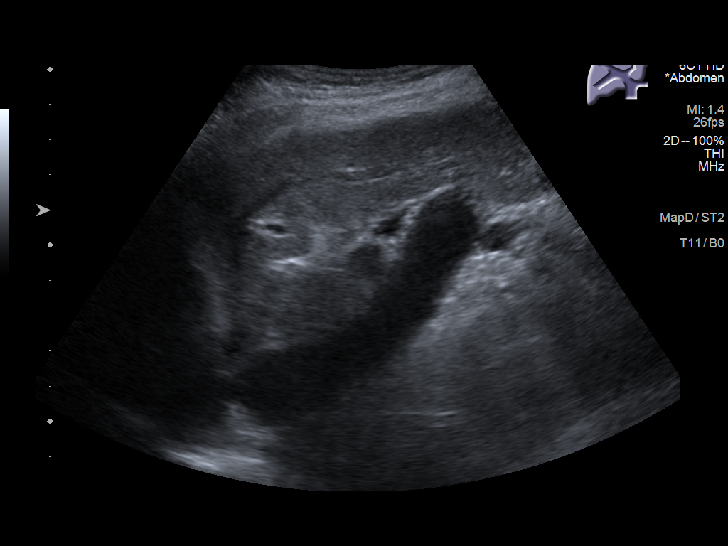
[im 23/49]
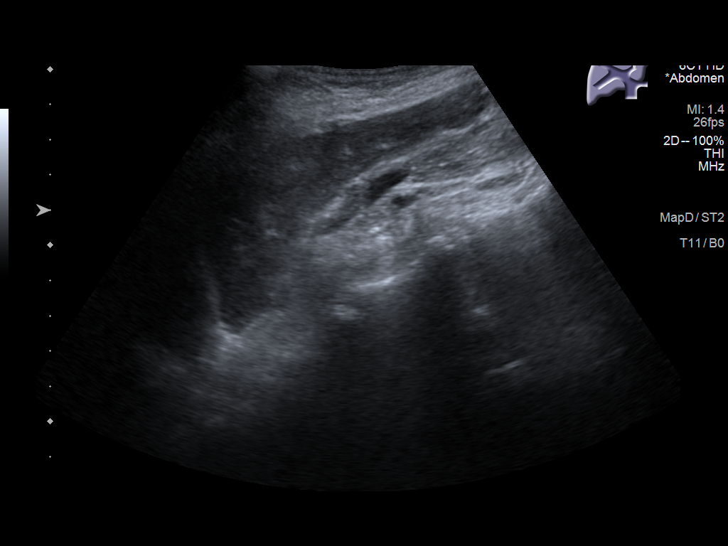
[im 27/49]
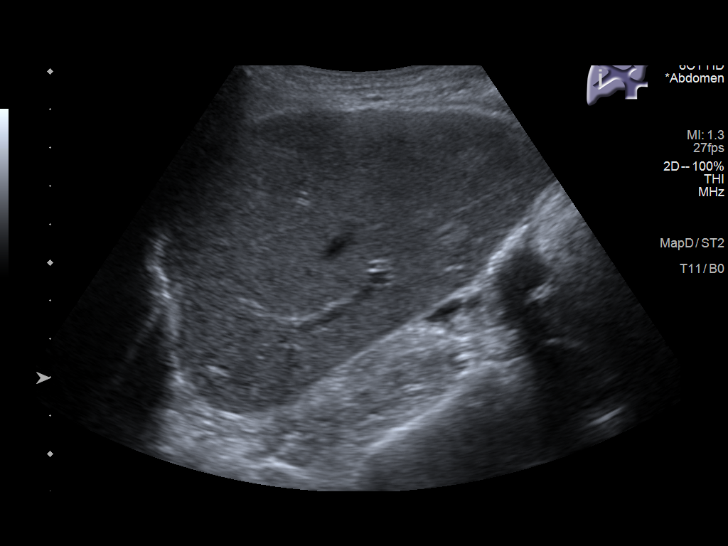
[im 31/49]
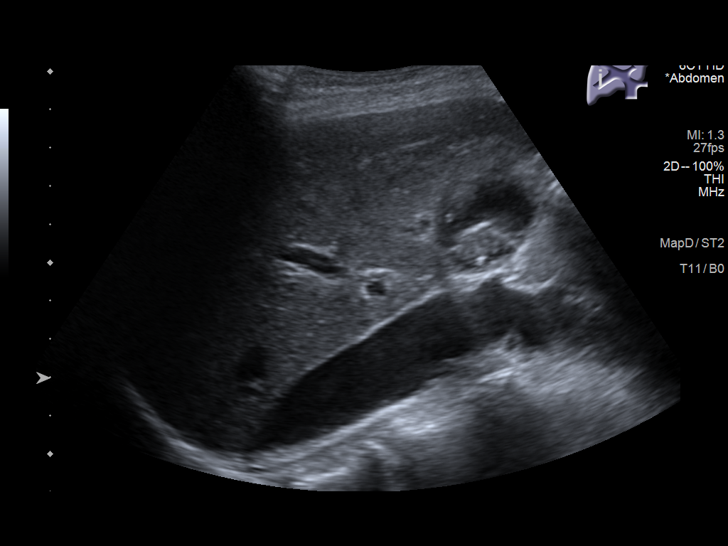
[im 33/49]
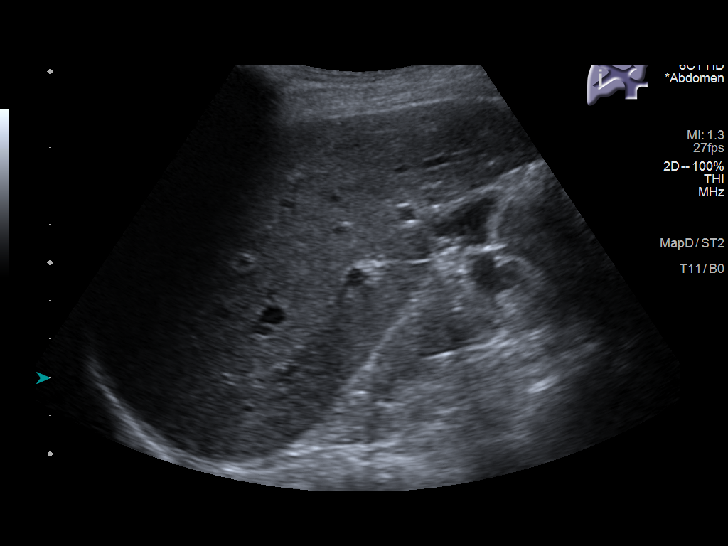
[im 37/49]
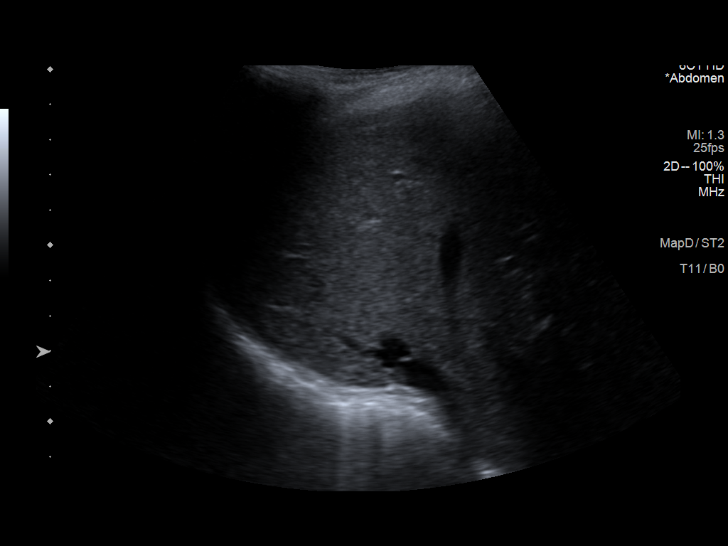
[im 41/49]
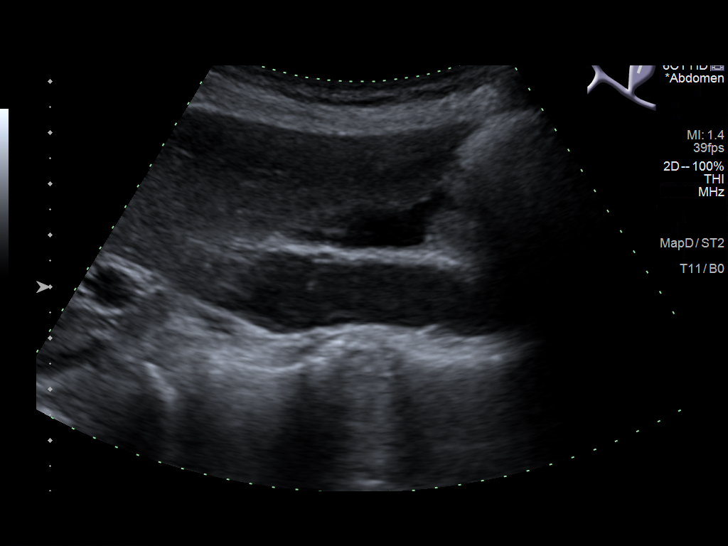
[im 45/49]
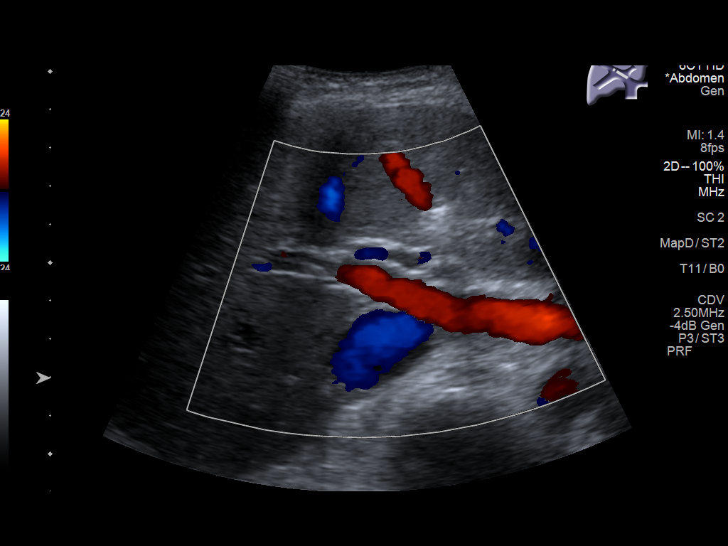
[im 49/49]
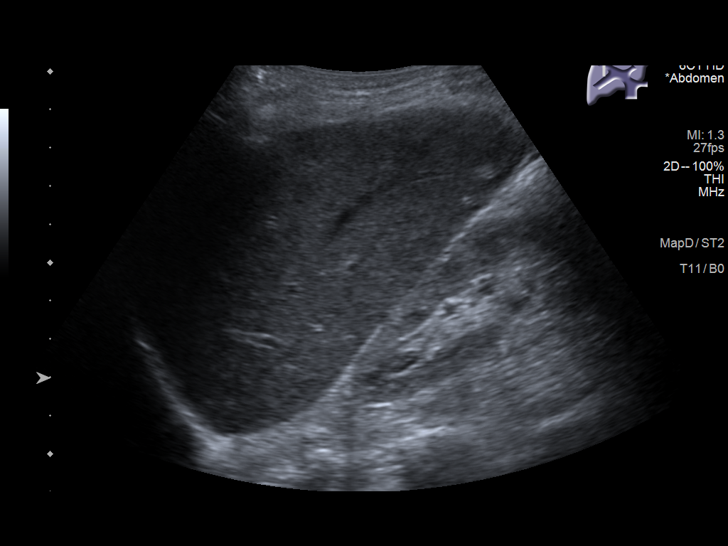

[14 of 25 positions shown; findings below may reference images not displayed]

FINDINGS: Gallbladder:

Multiple stones in the gallbladder lumen. No gallbladder wall
thickening or pericholecystic fluid. Negative sonographic Murphy's
sign.

Common bile duct:

Diameter: 6 mm

Liver:

No focal lesion identified. Within normal limits in parenchymal
echogenicity. Portal vein is patent on color Doppler imaging with
normal direction of blood flow towards the liver.

Other: None.
IMPRESSION: Cholelithiasis without secondary signs of acute cholecystitis.

## 2023-12-21 ENCOUNTER — Encounter: Payer: Self-pay | Admitting: Emergency Medicine
